# Patient Record
Sex: Female | Born: 1980
Health system: Southern US, Community
[De-identification: ages and names within clinical notes are randomized; demographics above are authoritative.]

## PROBLEM LIST (undated history)

## (undated) DIAGNOSIS — K219 Gastro-esophageal reflux disease without esophagitis: Secondary | ICD-10-CM

## (undated) DIAGNOSIS — K589 Irritable bowel syndrome without diarrhea: Secondary | ICD-10-CM

## (undated) HISTORY — PX: OTHER SURGICAL HISTORY: SHX169

## (undated) HISTORY — DX: Irritable bowel syndrome, unspecified: K58.9

## (undated) HISTORY — DX: Gastro-esophageal reflux disease without esophagitis: K21.9

## (undated) HISTORY — PX: WISDOM TOOTH EXTRACTION: SHX21

---

## 2003-10-30 ENCOUNTER — Other Ambulatory Visit: Admission: RE | Admit: 2003-10-30 | Discharge: 2003-10-30 | Payer: Self-pay

## 2004-06-24 ENCOUNTER — Other Ambulatory Visit: Admission: RE | Admit: 2004-06-24 | Discharge: 2004-06-24 | Payer: Self-pay

## 2007-09-26 ENCOUNTER — Inpatient Hospital Stay (HOSPITAL_COMMUNITY): Admission: AD | Admit: 2007-09-26 | Discharge: 2007-09-26 | Payer: Self-pay | Admitting: Obstetrics & Gynecology

## 2007-09-27 ENCOUNTER — Inpatient Hospital Stay (HOSPITAL_COMMUNITY): Admission: AD | Admit: 2007-09-27 | Discharge: 2007-09-29 | Payer: Self-pay | Admitting: Obstetrics and Gynecology

## 2009-01-03 ENCOUNTER — Inpatient Hospital Stay (HOSPITAL_COMMUNITY): Admission: RE | Admit: 2009-01-03 | Discharge: 2009-01-05 | Payer: Self-pay | Admitting: Obstetrics and Gynecology

## 2010-05-06 ENCOUNTER — Emergency Department (HOSPITAL_COMMUNITY): Admission: EM | Admit: 2010-05-06 | Discharge: 2010-05-06 | Payer: Self-pay | Admitting: Emergency Medicine

## 2011-01-11 LAB — BASIC METABOLIC PANEL
BUN: 18 mg/dL (ref 6–23)
Chloride: 105 mEq/L (ref 96–112)
Glucose, Bld: 96 mg/dL (ref 70–99)
Potassium: 3.4 mEq/L — ABNORMAL LOW (ref 3.5–5.1)

## 2011-01-11 LAB — URINALYSIS, ROUTINE W REFLEX MICROSCOPIC
Glucose, UA: NEGATIVE mg/dL
Specific Gravity, Urine: 1.02 (ref 1.005–1.030)
pH: 7.5 (ref 5.0–8.0)

## 2011-02-05 LAB — CBC
HCT: 25.6 % — ABNORMAL LOW (ref 36.0–46.0)
Hemoglobin: 8.4 g/dL — ABNORMAL LOW (ref 12.0–15.0)
Hemoglobin: 9.4 g/dL — ABNORMAL LOW (ref 12.0–15.0)
MCHC: 33 g/dL (ref 30.0–36.0)
MCV: 75.9 fL — ABNORMAL LOW (ref 78.0–100.0)
Platelets: 183 10*3/uL (ref 150–400)
RBC: 3.37 MIL/uL — ABNORMAL LOW (ref 3.87–5.11)
RBC: 3.75 MIL/uL — ABNORMAL LOW (ref 3.87–5.11)
WBC: 10 10*3/uL (ref 4.0–10.5)
WBC: 6.1 10*3/uL (ref 4.0–10.5)

## 2011-03-10 NOTE — Discharge Summary (Signed)
[name], Monica Young             ACCOUNT NO.:  [id number]   MEDICAL RECORD NO.:  [id number]          PATIENT TYPE:  INP   LOCATION:  9145                          FACILITY:  [facility]   PHYSICIAN:  Gerrit Friends. Aldona Bar, M.D.   DATE OF BIRTH:  Feb 12, 1981   DATE OF ADMISSION:  09/27/2007  DATE OF DISCHARGE:  09/29/2007                               DISCHARGE SUMMARY   DISCHARGE DIAGNOSES:  1. Term pregnancy, delivered 7-pound 15-ounce female infant, Apgars 7      and __________ .  2. Blood type O positive.  3. Postpartum anemia.   PROCEDURES:  1. Low forceps delivery.  2. Manual exploration of uterus with manual removal of placenta.  3. Partial third-degree tear with repair.   SUMMARY:  This 30 year old primigravida was admitted at term in labor  after a relatively uncomplicated pregnancy.  She progressed and was at  +3 station after approximately 2 hours of pushing, and her delivery was  aided by the use of forceps applied by Dr. Henderson Cloud.  She delivered a 7-  pound 15-ounce female infant with Apgars of 7 and 9 over a partial third-  degree tear which was also ultimately repaired without difficulty.  The  patient did have some difficulty with postpartum bleeding secondary to  the placenta having to be removed manually.  There was an estimated  blood loss of approximately 750 mL.  The patient did undergo manual  exploration of the uterus which was negative after placental delivery.  Her postpartum atony/bleeding was treated with Methergine and ultimately  Hemabate with good results.  Her postpartum hemoglobin was 6.1 on 12/03  and 5.6 on 12/04; it was 11.3 prior to delivery shortly after admission.  Her white count on the morning of 12/04 was 9800 and her platelet count  was 196,000.   In spite of the patient's anemia, she was able to ambulate pretty well  without difficulty and breastfeed without difficulty.  She was given  very specific appropriate instructions as to how to deal with the  fact  that her blood count was down in terms of activity, etc.  Blood  transfusion was discussed with the patient, but it was decided after  observing the patient and talking to the patient that it was not  medically necessary at this time.   The patient was given all appropriate instructions at the time of  discharge per discharge brochure and understood all instructions well.   DISCHARGE MEDICATIONS:  Include:  1. Vitamins 1 daily.  2. Feosol capsules 1 daily.  3. She will use Motrin 600 mg every 6 hours as needed for cramping or      pain and Tylox 1 every 4 to 6 hours as needed for severe cramping      and severe pain.   She was asked to return to the office in approximately 1 weeks' time for  followup and again in 4 weeks for her regular postpartum visit.  She was  given specific criteria to consider and was told that if she should  start feeling dizzy, very lightheaded, or was experiencing increased  bleeding  that she probably should return to [facility] for  consideration of a transfusion after evaluation and checking her blood  count.   At this point, the patient appears very stable for discharge and was  discharged to home with appropriate instructions.   CONDITION ON DISCHARGE:  Stable/improved.      Gerrit Friends. Aldona Bar, M.D.  Electronically Signed     RMW/MEDQ  D:  09/29/2007  T:  09/29/2007  Job:  [id number]

## 2011-03-10 NOTE — Discharge Summary (Signed)
[name], Monica Young NO.:  [id number]   MEDICAL RECORD NO.:  [id number]          PATIENT TYPE:  INP   LOCATION:  9127                          FACILITY:  [facility]   PHYSICIAN:  Gerrit Friends. Aldona Bar, M.D.   DATE OF BIRTH:  01-Sep-1981   DATE OF ADMISSION:  01/03/2009  DATE OF DISCHARGE:  01/05/2009                               DISCHARGE SUMMARY   DISCHARGE DIAGNOSES:  1. Term pregnancy delivered 7 pounds 11 ounces, female infant, Apgars 9      and 9.  2. Blood type O positive.  3. Induction of labor.   PROCEDURES:  1. Normal spontaneous delivery.  2. Second-degree midline episiotomy and repair.   SUMMARY:  This gravida 2, para 93, 30 year old was admitted at term for  elective induction by Dr. Henderson Cloud.  She progressed and subsequently had  a normal spontaneous delivery of a viable female infant weighing 7 pounds  11 ounces.  She was delivered over a second-degree midline episiotomy,  which was repaired without difficulty.  She had a normal postpartum  course.  Discharge hemoglobin was 8.4 with a white count of 10,000, and  platelet count of 183,000.  Baby was circumcised on the first postpartum  day.  The morning of January 05, 2009, mother was ambulating well,  tolerating a regular diet well, breast-feeding without difficulty.  Vital signs were stable.  She was comfortable.  She was given a  discharge brochure and understood all instructions well.   DISCHARGE MEDICATIONS:  Vitamins - 1 a day, Feosol capsules - 1 daily,  Motrin 600 mg every 6 hours as needed for mild cramping and pain, and  Tylox 1 or 2 every 4-6 hours as needed for more severe pain.   She will return the office for followup in approximately 4 weeks' time  or as needed.   CONDITION ON DISCHARGE:  Improved.      Gerrit Friends. Aldona Bar, M.D.  Electronically Signed     RMW/MEDQ  D:  01/05/2009  T:  01/05/2009  Job:  [id number]

## 2011-08-03 LAB — CBC
HCT: 16 — ABNORMAL LOW
HCT: 17.8 — ABNORMAL LOW
HCT: 31.5 — ABNORMAL LOW
Hemoglobin: 10.9 — ABNORMAL LOW
Hemoglobin: 11.3 — ABNORMAL LOW
Hemoglobin: 5.6 — CL
MCHC: 34.5
MCHC: 34.7
MCV: 84.9
MCV: 85.4
MCV: 85.5
Platelets: 180
Platelets: 196
RBC: 2.08 — ABNORMAL LOW
RBC: 3.71 — ABNORMAL LOW
RBC: 3.86 — ABNORMAL LOW
RDW: 13.2
WBC: 12.6 — ABNORMAL HIGH
WBC: 9.8

## 2011-08-03 LAB — COMPREHENSIVE METABOLIC PANEL
BUN: 3 — ABNORMAL LOW
CO2: 19
Calcium: 8.9
Creatinine, Ser: 0.51
GFR calc non Af Amer: >60
Glucose, Bld: 91
Total Protein: 6.2

## 2011-08-03 LAB — LACTATE DEHYDROGENASE: LDH: 171

## 2011-08-03 LAB — URIC ACID: Uric Acid, Serum: 5

## 2011-08-03 LAB — RPR: RPR Ser Ql: NONREACTIVE

## 2012-05-20 ENCOUNTER — Other Ambulatory Visit: Payer: Self-pay | Admitting: Family Medicine

## 2012-05-20 DIAGNOSIS — R109 Unspecified abdominal pain: Secondary | ICD-10-CM

## 2012-05-26 ENCOUNTER — Ambulatory Visit (HOSPITAL_COMMUNITY)
Admission: RE | Admit: 2012-05-26 | Discharge: 2012-05-26 | Disposition: A | Discharge status: Home / Self Care | Payer: No Typology Code available for payment source | Source: Ambulatory Visit | Attending: Family Medicine | Admitting: Family Medicine

## 2012-05-26 DIAGNOSIS — R109 Unspecified abdominal pain: Secondary | ICD-10-CM

## 2013-03-07 ENCOUNTER — Telehealth: Payer: Self-pay | Admitting: Family Medicine

## 2013-03-07 MED ORDER — FLUCONAZOLE 150 MG PO TABS: ORAL_TABLET | ORAL | Status: DC

## 2013-03-07 NOTE — Telephone Encounter (Signed)
Sent RX to [facility]. Patient was notified.

## 2013-03-07 NOTE — Telephone Encounter (Signed)
Diflucan 150 mg. #2. One every week. One refill.

## 2013-03-07 NOTE — Telephone Encounter (Signed)
Pt would like diflucan x2 for yeast infection, has already tried OTC and not working. [facility]

## 2013-03-08 ENCOUNTER — Encounter: Payer: Self-pay | Admitting: *Deleted

## 2013-06-27 ENCOUNTER — Telehealth: Payer: Self-pay | Admitting: Family Medicine

## 2013-06-27 NOTE — Telephone Encounter (Signed)
Patient needs refill on diflucan for yeast infection called into [facility].

## 2013-06-30 ENCOUNTER — Other Ambulatory Visit: Payer: Self-pay | Admitting: Nurse Practitioner

## 2013-06-30 ENCOUNTER — Telehealth: Payer: Self-pay | Admitting: Family Medicine

## 2013-06-30 MED ORDER — FLUCONAZOLE 150 MG PO TABS: ORAL_TABLET | ORAL | Status: DC

## 2013-06-30 NOTE — Telephone Encounter (Signed)
Patient phoned our office on 06/27/2013 regarding a refill on fluconazole (DIFLUCAN) 150 MG tablet.  States her symptoms are worse today as she was at the pool all day on Labor Day which patient states makes these symptoms appear.    Please phone fluconazole (DIFLUCAN) 150 MG tablet into [facility] in [place], [place]  Please call Patient. Thanks

## 2013-08-23 ENCOUNTER — Encounter: Payer: Self-pay | Admitting: Nurse Practitioner

## 2013-08-23 ENCOUNTER — Ambulatory Visit (INDEPENDENT_AMBULATORY_CARE_PROVIDER_SITE_OTHER): Payer: 59 | Admitting: Nurse Practitioner

## 2013-08-23 VITALS — BP 128/80 | Ht 64.0 in | Wt 130.2 lb

## 2013-08-23 DIAGNOSIS — K589 Irritable bowel syndrome without diarrhea: Secondary | ICD-10-CM

## 2013-08-23 DIAGNOSIS — F341 Dysthymic disorder: Secondary | ICD-10-CM

## 2013-08-23 DIAGNOSIS — F329 Major depressive disorder, single episode, unspecified: Secondary | ICD-10-CM

## 2013-08-23 MED ORDER — HYOSCYAMINE SULFATE 0.125 MG SL SUBL: 0.1250 mg | SUBLINGUAL_TABLET | SUBLINGUAL | Status: DC | PRN

## 2013-08-23 MED ORDER — SERTRALINE HCL 50 MG PO TABS: 50.0000 mg | ORAL_TABLET | Freq: Every day | ORAL | Status: DC

## 2013-08-23 MED ORDER — DIPHENOXYLATE-ATROPINE 2.5-0.025 MG PO TABS: 1.0000 | ORAL_TABLET | Freq: Four times a day (QID) | ORAL | Status: DC | PRN

## 2013-08-23 NOTE — Patient Instructions (Signed)
Irritable Bowel Syndrome °Irritable Bowel Syndrome (IBS) is caused by a disturbance of normal bowel function. Other terms used are spastic colon, mucous colitis, and irritable colon. It does not require surgery, nor does it lead to cancer. There is no cure for IBS. But with proper diet, stress reduction, and medication, you will find that your problems (symptoms) will gradually disappear or improve. IBS is a common digestive disorder. It usually appears in late adolescence or early adulthood. Women develop it twice as often as men. °CAUSES  °After food has been digested and absorbed in the small intestine, waste material is moved into the colon (large intestine). In the colon, water and salts are absorbed from the undigested products coming from the small intestine. The remaining residue, or fecal material, is held for elimination. Under normal circumstances, gentle, rhythmic contractions on the bowel walls push the fecal material along the colon towards the rectum. In IBS, however, these contractions are irregular and poorly coordinated. The fecal material is either retained too long, resulting in constipation, or expelled too soon, producing diarrhea. °SYMPTOMS  °The most common symptom of IBS is pain. It is typically in the lower left side of the belly (abdomen). But it may occur anywhere in the abdomen. It can be felt as heartburn, backache, or even as a dull pain in the arms or shoulders. The pain comes from excessive bowel-muscle spasms and from the buildup of gas and fecal material in the colon. This pain: °· Can range from sharp belly (abdominal) cramps to a dull, continuous ache. °· Usually worsens soon after eating. °· Is typically relieved by having a bowel movement or passing gas. °Abdominal pain is usually accompanied by constipation. But it may also produce diarrhea. The diarrhea typically occurs right after a meal or upon arising in the morning. The stools are typically soft and watery. They are often  flecked with secretions (mucus). °Other symptoms of IBS include: °· Bloating. °· Loss of appetite. °· Heartburn. °· Feeling sick to your stomach (nausea). °· Belching °· Vomiting °· Gas. °IBS may also cause a number of symptoms that are unrelated to the digestive system: °· Fatigue. °· Headaches. °· Anxiety °· Shortness of breath °· Difficulty in concentrating. °· Dizziness. °These symptoms tend to come and go. °DIAGNOSIS  °The symptoms of IBS closely mimic the symptoms of other, more serious digestive disorders. So your caregiver may wish to perform a variety of additional tests to exclude these disorders. He/she wants to be certain of learning what is wrong (diagnosis). The nature and purpose of each test will be explained to you. °TREATMENT °A number of medications are available to help correct bowel function and/or relieve bowel spasms and abdominal pain. Among the drugs available are: °· Mild, non-irritating laxatives for severe constipation and to help restore normal bowel habits. °· Specific anti-diarrheal medications to treat severe or prolonged diarrhea. °· Anti-spasmodic agents to relieve intestinal cramps. °· Your caregiver may also decide to treat you with a mild tranquilizer or sedative during unusually stressful periods in your life. °The important thing to remember is that if any drug is prescribed for you, make sure that you take it exactly as directed. Make sure that your caregiver knows how well it worked for you. °HOME CARE INSTRUCTIONS  °· Avoid foods that are high in fat or oils. Some examples are:heavy cream, butter, frankfurters, sausage, and other fatty meats. °· Avoid foods that have a laxative effect, such as fruit, fruit juice, and dairy products. °· Cut out   carbonated drinks, chewing gum, and "gassy" foods, such as beans and cabbage. This may help relieve bloating and belching. °· Bran taken with plenty of liquids may help relieve constipation. °· Keep track of what foods seem to trigger  your symptoms. °· Avoid emotionally charged situations or circumstances that produce anxiety. °· Start or continue exercising. °· Get plenty of rest and sleep. °MAKE SURE YOU:  °· Understand these instructions. °· Will watch your condition. °· Will get help right away if you are not doing well or get worse. °Document Released: 10/12/2005 Document Revised: 01/04/2012 Document Reviewed: 06/01/2008 °ExitCare® Patient Information ©2014 [employer]. ° °

## 2013-08-30 ENCOUNTER — Encounter: Payer: Self-pay | Admitting: Nurse Practitioner

## 2013-08-30 DIAGNOSIS — F329 Major depressive disorder, single episode, unspecified: Secondary | ICD-10-CM | POA: Insufficient documentation

## 2013-08-30 DIAGNOSIS — K589 Irritable bowel syndrome without diarrhea: Secondary | ICD-10-CM | POA: Insufficient documentation

## 2013-08-30 NOTE — Assessment & Plan Note (Signed)
Start Zoloft as directed. Recheck in one month.

## 2013-08-30 NOTE — Progress Notes (Signed)
Subjective:    Patient ID: Monica Young, female    DOB: May 25, 1981, 32 y.o.   MRN: [id number]  HPI presents for wellness checkup. Has Mirena for birth control. No bleeding. Same sexual partner. Rare acid reflux. Diarrhea, gas and bloating. No fever. No blood in stool. No change in color of stools. Under more stress; nervous and anxious at times. Symptoms worse with greasy foods.  Fatigue and hypersomnia for 5-6 months. No early morning awakenings. Denies suicidal thoughts or ideations.  History of IBS according to paper chart. Regular physicals with GYN.    Review of Systems  Constitutional: Negative for activity change, appetite change and fatigue.  HENT: Negative for dental problem, ear pain, hearing loss, rhinorrhea and sore throat.   Eyes: Negative for visual disturbance.  Respiratory: Negative for cough, chest tightness, shortness of breath and wheezing.   Cardiovascular: Negative for chest pain.  Gastrointestinal: Positive for nausea, diarrhea and abdominal distention. Negative for vomiting, abdominal pain, constipation and blood in stool.  Genitourinary: Negative for dysuria, urgency, frequency, vaginal bleeding, vaginal discharge, difficulty urinating, menstrual problem and pelvic pain.  Psychiatric/Behavioral: Positive for dysphoric mood. Negative for suicidal ideas. The patient is nervous/anxious.        Objective:   Physical Exam  Vitals reviewed. Constitutional: She is oriented to person, place, and time. She appears well-developed. No distress.  HENT:  Right Ear: External ear normal.  Left Ear: External ear normal.  Mouth/Throat: Oropharynx is clear and moist.  Neck: Normal range of motion. Neck supple. No tracheal deviation present. No thyromegaly present.  Cardiovascular: Normal rate, regular rhythm and normal heart sounds.  Exam reveals no gallop.   No murmur heard. Pulmonary/Chest: Effort normal and breath sounds normal.  Abdominal: Soft. Bowel sounds are normal.  She exhibits no distension and no mass. There is no tenderness. There is no rebound and no guarding.  Genitourinary: Vagina normal and uterus normal. No vaginal discharge found.  Musculoskeletal: She exhibits no edema.  Lymphadenopathy:    She has no cervical adenopathy.  Neurological: She is alert and oriented to person, place, and time.  Skin: Skin is warm and dry. No rash noted.  Psychiatric: She has a normal mood and affect. Her behavior is normal.  Abdominal US July 2013 was normal. Correction to above: No GU exam performed today.        Assessment & Plan:  Irritable bowel syndrome  Anxiety and depression    Meds ordered this encounter  Medications  . sertraline (ZOLOFT) 50 MG tablet    Sig: Take 1 tablet (50 mg total) by mouth daily.    Dispense:  30 tablet    Refill:  2    Order Specific Question:  Supervising Provider    Answer:  Merlyn Albert [[id number]]  . diphenoxylate-atropine (LOMOTIL) 2.5-0.025 MG per tablet    Sig: Take 1 tablet by mouth 4 (four) times daily as needed for diarrhea or loose stools.    Dispense:  30 tablet    Refill:  0    Order Specific Question:  Supervising Provider    Answer:  Merlyn Albert [[id number]]  . hyoscyamine (LEVSIN/SL) 0.125 MG SL tablet    Sig: Place 1 tablet (0.125 mg total) under the tongue every 4 (four) hours as needed for cramping.    Dispense:  30 tablet    Refill:  0    Order Specific Question:  Supervising Provider    Answer:  Merlyn Albert [[id number]]   Follow up  in one month. Call sooner if any problems.

## 2013-08-30 NOTE — Assessment & Plan Note (Signed)
Start Levsin and Lomotil as directed. Given information on IBS.

## 2013-08-31 ENCOUNTER — Other Ambulatory Visit: Payer: Self-pay

## 2013-09-20 ENCOUNTER — Encounter: Payer: Self-pay | Admitting: Family Medicine

## 2013-09-20 ENCOUNTER — Ambulatory Visit (INDEPENDENT_AMBULATORY_CARE_PROVIDER_SITE_OTHER): Payer: 59 | Admitting: Family Medicine

## 2013-09-20 VITALS — BP 112/68 | Ht 64.0 in | Wt 130.2 lb

## 2013-09-20 DIAGNOSIS — R5381 Other malaise: Secondary | ICD-10-CM

## 2013-09-20 DIAGNOSIS — D649 Anemia, unspecified: Secondary | ICD-10-CM

## 2013-09-20 DIAGNOSIS — R197 Diarrhea, unspecified: Secondary | ICD-10-CM

## 2013-09-20 LAB — CBC WITH DIFFERENTIAL/PLATELET
Basophils Absolute: 0 10*3/uL (ref 0.0–0.1)
Basophils Relative: 0 % (ref 0–1)
Eosinophils Absolute: 0.1 10*3/uL (ref 0.0–0.7)
Eosinophils Relative: 1 % (ref 0–5)
HCT: 36.3 % (ref 36.0–46.0)
Hemoglobin: 12.4 g/dL (ref 12.0–15.0)
MCH: 28.6 pg (ref 26.0–34.0)
MCHC: 34.2 g/dL (ref 30.0–36.0)
Monocytes Absolute: 0.3 10*3/uL (ref 0.1–1.0)
Monocytes Relative: 6 % (ref 3–12)
Neutro Abs: 3.6 10*3/uL (ref 1.7–7.7)
RDW: 13.5 % (ref 11.5–15.5)

## 2013-09-20 MED ORDER — SERTRALINE HCL 100 MG PO TABS: 100.0000 mg | ORAL_TABLET | Freq: Every day | ORAL | Status: DC

## 2013-09-20 NOTE — Progress Notes (Signed)
   Subjective:    Patient ID: Monica Young, female    DOB: 06-02-1981, 32 y.o.   MRN: [id number]  HPI Patient is here today for a follow up visit on anxiety ad depression. Currently taking zoloft 50 mg daily. Patient states that her symptoms are about the same and medication doesn't seem to help much.  Patient has been having melancholy. She denies being super sad denies crying spells she has lack of interest in doing things. She relates she has a lot of abdominal cramps loose stools in addition to that she finds her energy level subpar. She relates frustrations in dealing with some of the stresses of life not suicidal or homicidal. PMH IBS Review of Systems See above    Objective:   Physical Exam Lungs are clear hearts regular pulse normal weight good blood pressure good patient not tearful. Able to smile with interaction.       Assessment & Plan:  #1 depression/dysthymia -- increase Zoloft to 100 mg daily patient will send me a message in a few weeks time to let you know how she is doing if she should become suicidal or severely depressed to followup immediately or go to the ER  #2 loose stools intermittent abdominal discomforts will do extensive lab testing including looking for celiac disease  #3 fatigue check thyroid function has history of anemia check CBC

## 2013-09-21 LAB — BASIC METABOLIC PANEL
BUN: 12 mg/dL (ref 6–23)
CO2: 27 mEq/L (ref 19–32)
Chloride: 105 mEq/L (ref 96–112)
Glucose, Bld: 78 mg/dL (ref 70–99)
Potassium: 3.8 mEq/L (ref 3.5–5.3)
Sodium: 140 mEq/L (ref 135–145)

## 2013-09-25 LAB — TISSUE TRANSGLUTAMINASE, IGA: Tissue Transglutaminase Ab, IgA: 2.6 U/mL (ref <20)

## 2014-01-31 ENCOUNTER — Telehealth: Payer: Self-pay | Admitting: Family Medicine

## 2014-01-31 NOTE — Telephone Encounter (Signed)
Patient is going on vacation and she has to ride in an airplane. She is wanting to know if she can possibly get 4 pills or something for her nerves so that she can be calm on the plane. [facility]

## 2014-02-01 MED ORDER — ALPRAZOLAM 0.5 MG PO TABS: ORAL_TABLET | ORAL | Status: DC

## 2014-02-01 NOTE — Telephone Encounter (Signed)
Xanax .5 mg numb four one one hr before flight

## 2014-02-01 NOTE — Telephone Encounter (Signed)
Patient notified script will be faxed to pharmacy once doctor signs it. Patient verbalized understanding.

## 2014-02-14 ENCOUNTER — Other Ambulatory Visit: Payer: Self-pay | Admitting: Nurse Practitioner

## 2014-02-14 ENCOUNTER — Other Ambulatory Visit: Payer: Self-pay | Admitting: *Deleted

## 2014-02-14 ENCOUNTER — Telehealth: Payer: Self-pay | Admitting: Family Medicine

## 2014-02-14 MED ORDER — FLUCONAZOLE 150 MG PO TABS: ORAL_TABLET | ORAL | Status: DC

## 2014-02-14 NOTE — Telephone Encounter (Signed)
Pt needs diflucan x2 two please   Monica Young [facility]   [place] going to [place] 16[place]  Wants to know if she can get  Some diflucan x2 for that trip as well Or does she need to call back for that?

## 2014-02-15 NOTE — Telephone Encounter (Signed)
Was this pt going to be able to get the  x2 diflucan to have on hand for her trip to [place]?

## 2014-03-01 ENCOUNTER — Telehealth: Payer: Self-pay | Admitting: Family Medicine

## 2014-03-01 MED ORDER — ALPRAZOLAM 0.5 MG PO TABS: ORAL_TABLET | ORAL | Status: DC

## 2014-03-01 NOTE — Telephone Encounter (Signed)
[facility]

## 2014-03-01 NOTE — Telephone Encounter (Signed)
Notified patient script will be faxed to pharmacy. FYI- Patient states she never picked up the xanax rx in April from pharmacy. Pharmacy told her they never received anything from us.

## 2014-03-01 NOTE — Telephone Encounter (Signed)
Last seen 09/2013

## 2014-03-01 NOTE — Telephone Encounter (Signed)
1 refill, will need OV before further

## 2014-03-21 ENCOUNTER — Encounter: Payer: 59 | Admitting: Adult Health

## 2014-03-28 ENCOUNTER — Encounter: Payer: 59 | Admitting: Adult Health

## 2014-05-09 ENCOUNTER — Encounter: Payer: Self-pay | Admitting: Women's Health

## 2014-05-09 ENCOUNTER — Other Ambulatory Visit (HOSPITAL_COMMUNITY)
Admission: RE | Admit: 2014-05-09 | Discharge: 2014-05-09 | Disposition: A | Discharge status: Home / Self Care | Payer: BC Managed Care – PPO | Source: Ambulatory Visit | Attending: Obstetrics & Gynecology | Admitting: Obstetrics & Gynecology

## 2014-05-09 ENCOUNTER — Ambulatory Visit (INDEPENDENT_AMBULATORY_CARE_PROVIDER_SITE_OTHER): Payer: BC Managed Care – PPO | Admitting: Women's Health

## 2014-05-09 VITALS — BP 98/58 | Ht 64.0 in | Wt 133.0 lb

## 2014-05-09 DIAGNOSIS — Z01419 Encounter for gynecological examination (general) (routine) without abnormal findings: Secondary | ICD-10-CM

## 2014-05-09 DIAGNOSIS — Z1151 Encounter for screening for human papillomavirus (HPV): Secondary | ICD-10-CM | POA: Insufficient documentation

## 2014-05-09 DIAGNOSIS — Z3009 Encounter for other general counseling and advice on contraception: Secondary | ICD-10-CM

## 2014-05-09 NOTE — Progress Notes (Signed)
Subjective:   Monica Young is a 33 y.o.  Caucasian female here for a routine well-woman exam.  No LMP recorded. Patient is not currently having periods (Reason: IUD).    Current complaints: Time for her Mirena to be removed, would like another. Has been having cramping and diarrhea for years, has been dx w/ IBS- not on any meds- was concerned it may be coming from mirena. Does not have periods on Mirena, I do not feel like her symptoms are r/t mirena.  States at her last exam, Mirena strings were not visible, had an u/s to confirm still there- and it was.  PCP: Dr. 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  Past Medical History Past Medical History  Diagnosis Date  . IBS (irritable bowel syndrome)     Past Surgical History History reviewed. No pertinent past surgical history.  Gynecologic History No obstetric history on file.  No LMP recorded. Patient is not currently having periods (Reason: IUD). Contraception: IUD Last Pap: unsure, >7785yr ago. Results were: normal Last mammogram: never. Results were: n/a Last TCS: never  Obstetric History OB History  No data available    Current Medications Current Outpatient Prescriptions on File Prior to Visit  Medication Sig Dispense Refill  . ALPRAZolam (XANAX) 0.5 MG tablet Take one tablet one hour before flight.  4 tablet  0  . diphenoxylate-atropine (LOMOTIL) 2.5-0.025 MG per tablet Take 1 tablet by mouth 4 (four) times daily as needed for diarrhea or loose stools.  30  tablet  0  . fluconazole (DIFLUCAN) 150 MG tablet One po qd prn yeast infection; may repeat in 3-4 days if needed  2 tablet  0  . hyoscyamine (LEVSIN/SL) 0.125 MG SL tablet Place 1 tablet (0.125 mg total) under the tongue every 4 (four) hours as needed for cramping.  30 tablet  0  . sertraline (ZOLOFT) 100 MG tablet Take 1 tablet (100 mg total) by mouth daily.  30 tablet  5   No current facility-administered medications on file prior to visit.    Review of Systems Patient denies any headaches, blurred vision, shortness of breath, chest pain, abdominal pain, problems with bowel movements, urination, or intercourse.  Objective:  BP 98/58  Ht 5\' 4"  (1.626 m)  Wt 133 lb (60.328 kg)  BMI 22.82 kg/m2 Physical Exam  General:  Well developed, well nourished, no acute distress. She is alert and oriented x3. Skin:  Warm and dry Neck:  Midline trachea, no thyromegaly or nodules Cardiovascular: Regular rate and rhythm, no murmur heard Lungs:  Effort normal, all lung fields clear to auscultation bilaterally Breasts:  No dominant palpable mass, retraction, or nipple discharge Abdomen:  Soft, non tender, no hepatosplenomegaly or masses Pelvic:  External genitalia is normal in appearance.  The vagina is normal in appearance. The cervix is bulbous, no CMT. Mirena strings NOT VISIBLE.  Thin prep pap is done w/  HR HPV cotesting. Uterus is felt to be normal size, shape, and contour.  No adnexal masses or tenderness noted. Extremities:  No swelling or varicosities noted Psych:  She has a normal mood and affect  Assessment:   Healthy well-woman exam Contraception counseling  Plan:  F/U tomorrow for Mirena removal and reinsertion- strings not visible- premedicate w/ ibuprofen Immodium prn diarrhea, can discuss further w/ PCP if needed Mammogram @33yo  or sooner if problems Colonoscopy @33yo  or sooner if problems  Marge Duncans CNM, [facility] 05/09/2014 9:11 AM

## 2014-05-09 NOTE — Patient Instructions (Signed)
Ibuprofen 400mg  2 hours before coming  Levonorgestrel intrauterine device (IUD)-Mirena What is this medicine? LEVONORGESTREL IUD (LEE voe nor jes trel) is a contraceptive (birth control) device. The device is placed inside the uterus by a healthcare professional. It is used to prevent pregnancy and can also be used to treat heavy bleeding that occurs during your period. Depending on the device, it can be used for 3 to 5 years. This medicine may be used for other purposes; ask your health care provider or pharmacist if you have questions. COMMON BRAND NAME(S): Gretta CoolMirena, Skyla What should I tell my health care provider before I take this medicine? They need to know if you have any of these conditions: -abnormal Pap smear -cancer of the breast, uterus, or cervix -diabetes -endometritis -genital or pelvic infection now or in the past -have more than one sexual partner or your partner has more than one partner -heart disease -history of an ectopic or tubal pregnancy -immune system problems -IUD in place -liver disease or tumor -problems with blood clots or take blood-thinners -use intravenous drugs -uterus of unusual shape -vaginal bleeding that has not been explained -an unusual or allergic reaction to levonorgestrel, other hormones, silicone, or polyethylene, medicines, foods, dyes, or preservatives -pregnant or trying to get pregnant -breast-feeding How should I use this medicine? This device is placed inside the uterus by a health care professional. Talk to your pediatrician regarding the use of this medicine in children. Special care may be needed. Overdosage: If you think you have taken too much of this medicine contact a poison control center or emergency room at once. NOTE: This medicine is only for you. Do not share this medicine with others. What if I miss a dose? This does not apply. What may interact with this medicine? Do not take this medicine with any of the following  medications: -amprenavir -bosentan -fosamprenavir This medicine may also interact with the following medications: -aprepitant -barbiturate medicines for inducing sleep or treating seizures -bexarotene -griseofulvin -medicines to treat seizures like carbamazepine, ethotoin, felbamate, oxcarbazepine, phenytoin, topiramate -modafinil -pioglitazone -rifabutin -rifampin -rifapentine -some medicines to treat HIV infection like atazanavir, indinavir, lopinavir, nelfinavir, tipranavir, ritonavir -St. John's wort -warfarin This list may not describe all possible interactions. Give your health care provider a list of all the medicines, herbs, non-prescription drugs, or dietary supplements you use. Also tell them if you smoke, drink alcohol, or use illegal drugs. Some items may interact with your medicine. What should I watch for while using this medicine? Visit your doctor or health care professional for regular check ups. See your doctor if you or your partner has sexual contact with others, becomes HIV positive, or gets a sexual transmitted disease. This product does not protect you against HIV infection (AIDS) or other sexually transmitted diseases. You can check the placement of the IUD yourself by reaching up to the top of your vagina with clean fingers to feel the threads. Do not pull on the threads. It is a good habit to check placement after each menstrual period. Call your doctor right away if you feel more of the IUD than just the threads or if you cannot feel the threads at all. The IUD may come out by itself. You may become pregnant if the device comes out. If you notice that the IUD has come out use a backup birth control method like condoms and call your health care provider. Using tampons will not change the position of the IUD and are okay to use during  your period. What side effects may I notice from receiving this medicine? Side effects that you should report to your doctor or  health care professional as soon as possible: -allergic reactions like skin rash, itching or hives, swelling of the face, lips, or tongue -fever, flu-like symptoms -genital sores -high blood pressure -no menstrual period for 6 weeks during use -pain, swelling, warmth in the leg -pelvic pain or tenderness -severe or sudden headache -signs of pregnancy -stomach cramping -sudden shortness of breath -trouble with balance, talking, or walking -unusual vaginal bleeding, discharge -yellowing of the eyes or skin Side effects that usually do not require medical attention (report to your doctor or health care professional if they continue or are bothersome): -acne -breast pain -change in sex drive or performance -changes in weight -cramping, dizziness, or faintness while the device is being inserted -headache -irregular menstrual bleeding within first 3 to 6 months of use -nausea This list may not describe all possible side effects. Call your doctor for medical advice about side effects. You may report side effects to FDA at 1-800-FDA-1088. Where should I keep my medicine? This does not apply. NOTE: This sheet is a summary. It may not cover all possible information. If you have questions about this medicine, talk to your doctor, pharmacist, or health care provider.  2015, Elsevier/Gold Standard. (2011-11-12 13:54:04)

## 2014-05-10 ENCOUNTER — Encounter: Payer: Self-pay | Admitting: Advanced Practice Midwife

## 2014-05-10 ENCOUNTER — Ambulatory Visit (INDEPENDENT_AMBULATORY_CARE_PROVIDER_SITE_OTHER): Payer: BC Managed Care – PPO | Admitting: Advanced Practice Midwife

## 2014-05-10 DIAGNOSIS — Z975 Presence of (intrauterine) contraceptive device: Principal | ICD-10-CM

## 2014-05-10 DIAGNOSIS — Z3202 Encounter for pregnancy test, result negative: Secondary | ICD-10-CM

## 2014-05-10 DIAGNOSIS — Z30432 Encounter for removal of intrauterine contraceptive device: Secondary | ICD-10-CM

## 2014-05-10 DIAGNOSIS — Z538 Procedure and treatment not carried out for other reasons: Secondary | ICD-10-CM

## 2014-05-10 LAB — CYTOLOGY - PAP

## 2014-05-10 LAB — POCT URINE PREGNANCY: Preg Test, Ur: NEGATIVE

## 2014-05-10 MED ORDER — MISOPROSTOL 200 MCG PO TABS: 200.0000 ug | ORAL_TABLET | Freq: Once | ORAL | Status: DC

## 2014-05-10 NOTE — Progress Notes (Signed)
Zenovia Jarredabitha D Hokenson is a 33 y.o. year old Caucasian female   who presents for removal and replacement of a Mirena IUD.  It has been 5 years since her previous IUD placement.  Past Medical History  Diagnosis Date  . IBS (irritable bowel syndrome)    History reviewed. No pertinent past surgical history.  Family History  Problem Relation Age of Onset  . Hypertension Father   . Heart disease Father   . Pancreatitis Brother   . Heart disease Maternal Grandmother   . Stroke Maternal Grandfather   . Cancer Paternal Grandmother     lung  . COPD Paternal Grandfather   . Heart disease Paternal Grandfather      The risks and benefits of the method and placement have been thouroughly reviewed with the patient and all questions were answered.  Specifically the patient is aware of failure rate of 10/998, expulsion of the IUD and of possible perforation.  The patient is aware of irregular bleeding due to the method and understands the incidence of irregular bleeding diminishes with time.  Time out was performed.  A Graves speculum was placed.  The cervix was prepped using Betadine. The strings were found to be not visible.  Placement confirmed by us at the very top of the fundus.  Despite multiple attempts with multiple instruments by myself and Dr. Emelda FearFerguson, removal was unsuccessful.  ASSESSMENT:  Unsuccessful removal of IUD  PLAN:  F/u at a later date;  Premedicate with PO cytotec and plan a paracervical block

## 2014-05-16 ENCOUNTER — Encounter: Payer: Self-pay | Admitting: Obstetrics and Gynecology

## 2014-05-16 ENCOUNTER — Ambulatory Visit (INDEPENDENT_AMBULATORY_CARE_PROVIDER_SITE_OTHER): Payer: BC Managed Care – PPO | Admitting: Obstetrics and Gynecology

## 2014-05-16 VITALS — BP 118/72 | Ht 64.0 in

## 2014-05-16 DIAGNOSIS — Z30432 Encounter for removal of intrauterine contraceptive device: Secondary | ICD-10-CM | POA: Insufficient documentation

## 2014-05-16 MED ORDER — NORGESTIMATE-ETH ESTRADIOL 0.25-35 MG-MCG PO TABS: 1.0000 | ORAL_TABLET | Freq: Every day | ORAL | Status: DC

## 2014-05-16 MED ORDER — DOXYCYCLINE HYCLATE 100 MG PO CAPS: 100.0000 mg | ORAL_CAPSULE | Freq: Two times a day (BID) | ORAL | Status: DC

## 2014-05-16 NOTE — Progress Notes (Signed)
This chart was scribed by Leone PayorSonum Patel, Medical Scribe, for Dr. Christin BachJohn Cyenna Rebello on 05/16/14 at 3:34 PM. This chart was reviewed by Dr. Christin BachJohn Jamear Carbonneau for accuracy.    Monica Young is a 33 y.o. female who presents today for an IUD removal and possible reinsertion. She states [facility] her last baby.  IUD Removal  Patient was in the dorsal lithotomy position, normal external genitalia was noted.  A speculum was placed in the patient's vagina, normal discharge was noted, no lesions. The multiparous cervix was visualized, no lesions, no abnormal discharge.  The strings of the IUD were grasped and pulled using ring forceps. The IUD was removed in its entirety. Ultrasound used and after multiple attempts, uterine packing forcep were able to remove the iud. (The strings of the IUD were not visualized, so Kelly forceps were introduced into the endometrial cavity and the IUD was grasped and removed in its entirety).  Patient tolerated the procedure well.    Patient will use OCP for contraception. Routine preventative health maintenance measures emphasized.

## 2014-05-17 ENCOUNTER — Ambulatory Visit: Payer: BC Managed Care – PPO | Admitting: Obstetrics & Gynecology

## 2014-05-28 ENCOUNTER — Telehealth: Payer: Self-pay | Admitting: Adult Health

## 2014-05-28 MED ORDER — FLUCONAZOLE 150 MG PO TABS: ORAL_TABLET | ORAL | Status: DC

## 2014-05-28 NOTE — Telephone Encounter (Signed)
rx diflucan with 1 refill

## 2014-05-28 NOTE — Telephone Encounter (Signed)
Pt states Dr. Emelda FearFerguson doxycycline a few days ago now having vaginal itching, thinks she has a yeast infection. Monistat not helping.   Requesting med for yeast.

## 2014-08-27 ENCOUNTER — Encounter: Payer: Self-pay | Admitting: Obstetrics and Gynecology

## 2015-01-01 ENCOUNTER — Ambulatory Visit (INDEPENDENT_AMBULATORY_CARE_PROVIDER_SITE_OTHER): Payer: BLUE CROSS/BLUE SHIELD | Admitting: Family Medicine

## 2015-01-01 ENCOUNTER — Encounter: Payer: Self-pay | Admitting: Family Medicine

## 2015-01-01 VITALS — BP 112/74 | Temp 98.9°F | Ht 64.0 in | Wt 130.0 lb

## 2015-01-01 DIAGNOSIS — J329 Chronic sinusitis, unspecified: Secondary | ICD-10-CM | POA: Diagnosis not present

## 2015-01-01 MED ORDER — CEFDINIR 300 MG PO CAPS: 300.0000 mg | ORAL_CAPSULE | Freq: Two times a day (BID) | ORAL | Status: DC

## 2015-01-01 MED ORDER — MUPIROCIN 2 % EX OINT: TOPICAL_OINTMENT | CUTANEOUS | Status: AC

## 2015-01-01 MED ORDER — ONDANSETRON 4 MG PO TBDP: 4.0000 mg | ORAL_TABLET | Freq: Three times a day (TID) | ORAL | Status: DC | PRN

## 2015-01-01 NOTE — Progress Notes (Signed)
   Subjective:    Patient ID: Zenovia Jarredabitha D Rabalais, female    DOB: 05/19/1981, 34 y.o.   MRN: [id number]  Cough This is a new problem. Episode onset: off and on for 3 weeks. Associated symptoms include a fever, headaches and a sore throat. Associated symptoms comments: Vomiting, runny nose,. Treatments tried: tylenol cold and flu, tylenol.  tyl cold and cong  soe throat new as of yesterday  Green disch and body aches  Joint aching  Thought she had the flu  Vomited several times  No smoking   Review of Systems  Constitutional: Positive for fever.  HENT: Positive for sore throat.   Respiratory: Positive for cough.   Neurological: Positive for headaches.       Objective:   Physical Exam   alert moderate malaise. Vitals stable. HEENT moderate nasal congestion duration of neck supple. Lungs clear no wheezes no crackles heart regular in rhythm      Assessment & Plan:   impression post viral rhinosinusitis/bronchitis plan Omnicef twice a day 10 days. Symptom care discussed. Warning signs discussed. WSL

## 2015-01-03 ENCOUNTER — Telehealth: Payer: Self-pay | Admitting: Family Medicine

## 2015-01-03 MED ORDER — FLUCONAZOLE 150 MG PO TABS: 150.0000 mg | ORAL_TABLET | Freq: Once | ORAL | Status: DC

## 2015-01-03 NOTE — Telephone Encounter (Signed)
Med sent per protocol. Pt notified.

## 2015-01-03 NOTE — Telephone Encounter (Signed)
Patient was put on an antibiotic on 01/01/15 and now has a yeast infection.  Can we call in diflucan?   [facility]

## 2015-02-18 ENCOUNTER — Encounter: Payer: Self-pay | Admitting: Family Medicine

## 2015-02-18 ENCOUNTER — Ambulatory Visit (INDEPENDENT_AMBULATORY_CARE_PROVIDER_SITE_OTHER): Payer: BLUE CROSS/BLUE SHIELD | Admitting: Family Medicine

## 2015-02-18 VITALS — BP 106/70 | Ht 64.0 in | Wt 131.2 lb

## 2015-02-18 DIAGNOSIS — E785 Hyperlipidemia, unspecified: Secondary | ICD-10-CM | POA: Diagnosis not present

## 2015-02-18 DIAGNOSIS — R002 Palpitations: Secondary | ICD-10-CM

## 2015-02-18 NOTE — Progress Notes (Signed)
   Subjective:    Patient ID: Monica Young, female    DOB: 08/23/1981, 34 y.o.   MRN: [id number]  Palpitations  This is a new problem. The current episode started more than 1 month ago. The problem occurs intermittently. The problem has been unchanged. Associated symptoms include shortness of breath. Pertinent negatives include no chest pain, coughing, diaphoresis, dizziness, numbness, syncope, vomiting or weakness. She has tried nothing for the symptoms.  Hyperlipidemia This is a new problem. This is a new diagnosis. Recent lipid tests were reviewed and are high. Associated symptoms include shortness of breath. Pertinent negatives include no chest pain. The current treatment provides mild improvement of lipids. There are no compliance problems.  There are no known risk factors for coronary artery disease.    Patient here to discuss labs following work physical. The patient went to her gynecologist they did lab work on her they told her overall she had high cholesterol she's also been experiencing some intermittent palpitations that last anywhere from 30 seconds to a minute and never seemed to be very fast one time she had a slight shortness of breath and nothing severe  Review of Systems  Constitutional: Negative for diaphoresis, activity change, appetite change and fatigue.  HENT: Negative for congestion.   Respiratory: Positive for shortness of breath. Negative for cough.   Cardiovascular: Positive for palpitations. Negative for chest pain and syncope.  Gastrointestinal: Negative for vomiting and abdominal pain.  Endocrine: Negative for polydipsia and polyphagia.  Neurological: Negative for dizziness, weakness and numbness.  Psychiatric/Behavioral: Negative for confusion.       Objective:   Physical Exam  Constitutional: She appears well-nourished. No distress.  Cardiovascular: Normal rate, regular rhythm and normal heart sounds.   No murmur heard. Pulmonary/Chest: Effort normal  and breath sounds normal. No respiratory distress.  Musculoskeletal: She exhibits no edema.  Lymphadenopathy:    She has no cervical adenopathy.  Neurological: She is alert. She exhibits normal muscle tone.  Psychiatric: Her behavior is normal.  Vitals reviewed.         Assessment & Plan:  Palpitations-her EKG looks good. Shows no sign of QTC work WPW. She needs a lower the amount of caffeine in her diet in also filled in some stress relievers. In addition to this if she has sustained tach Arty of organ 1-2 minutes she ought to figure out rate certainly follow-up with us go to ER if any emergency situations  Hyperlipidemia not bad enough start medicine recheck lab work again in one years time

## 2015-04-09 ENCOUNTER — Other Ambulatory Visit: Payer: Self-pay | Admitting: Obstetrics and Gynecology

## 2015-04-09 ENCOUNTER — Telehealth: Payer: Self-pay | Admitting: *Deleted

## 2015-04-09 MED ORDER — NORGESTIMATE-ETH ESTRADIOL 0.25-35 MG-MCG PO TABS: 1.0000 | ORAL_TABLET | Freq: Every day | ORAL | Status: DC

## 2015-04-09 NOTE — Telephone Encounter (Signed)
Pt requesting refill for 2 months on the Sprintec, states plans to get the Mirena after 2 months. Dr. Emelda Fear gave verbal order for Sprintec 1 tablet po daily, #30, 2 refills. Pt aware RX completed.

## 2015-05-08 ENCOUNTER — Encounter: Payer: Self-pay | Admitting: Women's Health

## 2015-05-08 ENCOUNTER — Ambulatory Visit (INDEPENDENT_AMBULATORY_CARE_PROVIDER_SITE_OTHER): Payer: BLUE CROSS/BLUE SHIELD | Admitting: Women's Health

## 2015-05-08 VITALS — BP 114/68 | HR 71 | Ht 64.0 in | Wt 131.0 lb

## 2015-05-08 DIAGNOSIS — Z30014 Encounter for initial prescription of intrauterine contraceptive device: Secondary | ICD-10-CM

## 2015-05-08 DIAGNOSIS — Z3202 Encounter for pregnancy test, result negative: Secondary | ICD-10-CM

## 2015-05-08 DIAGNOSIS — Z30430 Encounter for insertion of intrauterine contraceptive device: Secondary | ICD-10-CM | POA: Diagnosis not present

## 2015-05-08 DIAGNOSIS — Z975 Presence of (intrauterine) contraceptive device: Secondary | ICD-10-CM | POA: Insufficient documentation

## 2015-05-08 LAB — POCT URINE PREGNANCY: PREG TEST UR: NEGATIVE

## 2015-05-08 NOTE — Progress Notes (Signed)
Patient ID: Monica Young, female   DOB: 12/21/1980, 34 y.o.   MRN: [id number] Monica Jarredabitha D Nunn is a 34 y.o. year old [id number] Caucasian female who presents for placement of a Mirena IUD. She has had a Mirena IUD before, she reports it was embedded in her uterus and was a difficulty removal.   Patient's last menstrual period was 05/07/2015. BP 114/68 mmHg  Pulse 71  Ht 5\' 4"  (1.626 m)  Wt 131 lb (59.421 kg)  BMI 22.47 kg/m2  LMP 05/07/2015 Last sexual intercourse was 2wks ago, and pregnancy test today was neg, and she is on her period.  The risks and benefits of the method and placement have been thouroughly reviewed with the patient and all questions were answered.  Specifically the patient is aware of failure rate of 10/998, expulsion of the IUD and of possible perforation.  The patient is aware of irregular bleeding due to the method and understands the incidence of irregular bleeding diminishes with time.  Signed copy of informed consent in chart.   Time out was performed.  A graves speculum was placed in the vagina.  The cervix was visualized, prepped using Betadine, and grasped with a single tooth tenaculum. The uterus was found to be anteroflexed and it sounded to 7 cm.  Mirena IUD placed per manufacturer's recommendations.   The strings were trimmed to 3 cm.  Sonogram was performed and the proper placement of the IUD was verified via transvaginal u/s by myself and JAG.   The patient was given post procedure instructions, including signs and symptoms of infection and to check for the strings after each menses or each month, and refraining from intercourse or anything in the vagina for 3 days.  She was given a Mirena care card with date Mirena placed, and date Mirena to be removed.  She is scheduled for a f/u appointment in 4 weeks.  Marge DuncansBooker, Myran Arcia Randall CNM, [facility] 05/08/2015 11:46 AM

## 2015-05-08 NOTE — Patient Instructions (Signed)
 Nothing in vagina for 3 days (no sex, douching, tampons, etc...)  Check your strings once a month to make sure you can feel them, if you are not able to please let us know  If you develop a fever of 100.4 or more in the next few weeks, or if you develop severe abdominal pain, please let us know  Use a backup method of birth control, such as condoms, for 2 weeks   Levonorgestrel intrauterine device (IUD) What is this medicine? LEVONORGESTREL IUD (LEE voe nor jes trel) is a contraceptive (birth control) device. The device is placed inside the uterus by a healthcare professional. It is used to prevent pregnancy and can also be used to treat heavy bleeding that occurs during your period. Depending on the device, it can be used for 3 to 5 years. This medicine may be used for other purposes; ask your health care provider or pharmacist if you have questions. COMMON BRAND NAME(S): LILETTA, Mirena, Skyla What should I tell my health care provider before I take this medicine? They need to know if you have any of these conditions: -abnormal Pap smear -cancer of the breast, uterus, or cervix -diabetes -endometritis -genital or pelvic infection now or in the past -have more than one sexual partner or your partner has more than one partner -heart disease -history of an ectopic or tubal pregnancy -immune system problems -IUD in place -liver disease or tumor -problems with blood clots or take blood-thinners -use intravenous drugs -uterus of unusual shape -vaginal bleeding that has not been explained -an unusual or allergic reaction to levonorgestrel, other hormones, silicone, or polyethylene, medicines, foods, dyes, or preservatives -pregnant or trying to get pregnant -breast-feeding How should I use this medicine? This device is placed inside the uterus by a health care professional. Talk to your pediatrician regarding the use of this medicine in children. Special care may be  needed. Overdosage: If you think you have taken too much of this medicine contact a poison control center or emergency room at once. NOTE: This medicine is only for you. Do not share this medicine with others. What if I miss a dose? This does not apply. What may interact with this medicine? Do not take this medicine with any of the following medications: -amprenavir -bosentan -fosamprenavir This medicine may also interact with the following medications: -aprepitant -barbiturate medicines for inducing sleep or treating seizures -bexarotene -griseofulvin -medicines to treat seizures like carbamazepine, ethotoin, felbamate, oxcarbazepine, phenytoin, topiramate -modafinil -pioglitazone -rifabutin -rifampin -rifapentine -some medicines to treat HIV infection like atazanavir, indinavir, lopinavir, nelfinavir, tipranavir, ritonavir -St. John's wort -warfarin This list may not describe all possible interactions. Give your health care provider a list of all the medicines, herbs, non-prescription drugs, or dietary supplements you use. Also tell them if you smoke, drink alcohol, or use illegal drugs. Some items may interact with your medicine. What should I watch for while using this medicine? Visit your doctor or health care professional for regular check ups. See your doctor if you or your partner has sexual contact with others, becomes HIV positive, or gets a sexual transmitted disease. This product does not protect you against HIV infection (AIDS) or other sexually transmitted diseases. You can check the placement of the IUD yourself by reaching up to the top of your vagina with clean fingers to feel the threads. Do not pull on the threads. It is a good habit to check placement after each menstrual period. Call your doctor right away if you feel   more of the IUD than just the threads or if you cannot feel the threads at all. The IUD may come out by itself. You may become pregnant if the device  comes out. If you notice that the IUD has come out use a backup birth control method like condoms and call your health care provider. Using tampons will not change the position of the IUD and are okay to use during your period. What side effects may I notice from receiving this medicine? Side effects that you should report to your doctor or health care professional as soon as possible: -allergic reactions like skin rash, itching or hives, swelling of the face, lips, or tongue -fever, flu-like symptoms -genital sores -high blood pressure -no menstrual period for 6 weeks during use -pain, swelling, warmth in the leg -pelvic pain or tenderness -severe or sudden headache -signs of pregnancy -stomach cramping -sudden shortness of breath -trouble with balance, talking, or walking -unusual vaginal bleeding, discharge -yellowing of the eyes or skin Side effects that usually do not require medical attention (report to your doctor or health care professional if they continue or are bothersome): -acne -breast pain -change in sex drive or performance -changes in weight -cramping, dizziness, or faintness while the device is being inserted -headache -irregular menstrual bleeding within first 3 to 6 months of use -nausea This list may not describe all possible side effects. Call your doctor for medical advice about side effects. You may report side effects to FDA at 1-800-FDA-1088. Where should I keep my medicine? This does not apply. NOTE: This sheet is a summary. It may not cover all possible information. If you have questions about this medicine, talk to your doctor, pharmacist, or health care provider.  2015, Elsevier/Gold Standard. (2011-11-12 13:54:04)  

## 2015-05-22 ENCOUNTER — Ambulatory Visit (INDEPENDENT_AMBULATORY_CARE_PROVIDER_SITE_OTHER): Payer: BLUE CROSS/BLUE SHIELD | Admitting: Women's Health

## 2015-05-22 ENCOUNTER — Encounter: Payer: Self-pay | Admitting: Women's Health

## 2015-05-22 VITALS — BP 92/62 | HR 80 | Ht 63.5 in | Wt 130.0 lb

## 2015-05-22 DIAGNOSIS — Z01419 Encounter for gynecological examination (general) (routine) without abnormal findings: Secondary | ICD-10-CM

## 2015-05-22 DIAGNOSIS — Z30431 Encounter for routine checking of intrauterine contraceptive device: Secondary | ICD-10-CM

## 2015-05-22 NOTE — Patient Instructions (Signed)
.   Mediterranean style diet:  (for cholesterol) o High in fruits, vegs, whole grains, beans, nuts, seeds, olive oil o Low-Mod fish, poultry, dairy o Little red meat o

## 2015-05-22 NOTE — Progress Notes (Signed)
Patient ID: Monica Young, female   DOB: 1981-10-03, 34 y.o.   MRN: [id number] Subjective:   Monica Young is a [age] y.o. G85P2012 Caucasian female here for a routine well-woman exam.  Patient's last menstrual period was 05/07/2015.    Mirena IUD placed 2wks ago, no problems other than was unable to feel strings when checking Current complaints: none PCP: Dr. Gerda Diss       Does not desire labs, had labs recently for husband's insurance all normal except cholesterol slightly elevated- she has already discussed w/ Dr. Gerda Diss  Social History: Sexual: heterosexual Marital Status: married Living situation: with spouse Occupation: Engineer, civil (consulting) at [employer] Tobacco/alcohol: no tobacco or etoh Illicit drugs: no history of illicit drug use  The following portions of the patient's history were reviewed and updated as appropriate: allergies, current medications, past family history, past medical history, past social history, past surgical history and problem list.  Past Medical History Past Medical History  Diagnosis Date  . IBS (irritable bowel syndrome)     Past Surgical History History reviewed. No pertinent past surgical history.  Gynecologic History [id number]  Patient's last menstrual period was 05/07/2015. Contraception: IUD Last Pap: 2015. Results were: normal w/ -HRHPV Last mammogram: never. Results were: n/a Last TCS: never  Obstetric History OB History  Gravida Para Term Preterm AB SAB TAB Ectopic Multiple Living  2 2 2       2     # Outcome Date GA Lbr Len/2nd Weight Sex Delivery Anes PTL Lv  2 Term 01/03/09 [redacted]w[redacted]d  7 lb 11 oz (3.487 kg) M Vag-Spont EPI  Y  1 Term 09/27/07 [redacted]w[redacted]d  7 lb 15 oz (3.6 kg) F Vag-Spont EPI  Y      Current Medications Current Outpatient Prescriptions on File Prior to Visit  Medication Sig Dispense Refill  . Multiple Vitamin (MULTIVITAMIN) capsule Take 1 capsule by mouth daily.    . mupirocin ointment (BACTROBAN) 2 % Apply to affected area 3  times daily (Patient not taking: Reported on 05/08/2015) 22 g 0  . norgestimate-ethinyl estradiol (ORTHO-CYCLEN,SPRINTEC,PREVIFEM) 0.25-35 MG-MCG tablet Take 1 tablet by mouth daily. (Patient not taking: Reported on 05/08/2015) 1 Package 11  . norgestimate-ethinyl estradiol (ORTHO-CYCLEN,SPRINTEC,PREVIFEM) 0.25-35 MG-MCG tablet Take 1 tablet by mouth daily. (Patient not taking: Reported on 05/08/2015) 1 Package 2  . ondansetron (ZOFRAN ODT) 4 MG disintegrating tablet Take 1 tablet (4 mg total) by mouth every 8 (eight) hours as needed for nausea or vomiting. (Patient not taking: Reported on 05/08/2015) 20 tablet 0   No current facility-administered medications on file prior to visit.    Review of Systems Patient denies any headaches, blurred vision, shortness of breath, chest pain, abdominal pain, problems with bowel movements, urination, or intercourse.  Objective:  BP 92/62 mmHg  Pulse 80  Ht 5' 3.5" (1.613 m)  Wt 130 lb (58.968 kg)  BMI 22.66 kg/m2  LMP 05/07/2015 Physical Exam  General:  Well developed, well nourished, no acute distress. She is alert and oriented x3. Skin:  Warm and dry Neck:  Midline trachea, no thyromegaly or nodules Cardiovascular: Regular rate and rhythm, no murmur heard Lungs:  Effort normal, all lung fields clear to auscultation bilaterally Breasts:  No dominant palpable mass, retraction, or nipple discharge Abdomen:  Soft, non tender, no hepatosplenomegaly or masses Pelvic:  External genitalia is normal in appearance.  The vagina is normal in appearance. The cervix is bulbous, no CMT.  Mirena strings visible. Thin prep pap is not done  Uterus is felt to be normal size, shape, and contour.  No adnexal masses or tenderness noted. Extremities:  No swelling or varicosities noted Psych:  She has a normal mood and affect  Assessment:   Healthy well-woman exam IUD check  Plan:  F/U 64yr for physical, or sooner if needed Check IUD strings monthly Mammogram  or  sooner if problems Colonoscopy  or sooner if problems  Marge Duncans CNM, [facility] 05/22/2015 11:02 AM

## 2015-09-23 ENCOUNTER — Encounter: Payer: Self-pay | Admitting: Family Medicine

## 2015-09-23 ENCOUNTER — Ambulatory Visit (INDEPENDENT_AMBULATORY_CARE_PROVIDER_SITE_OTHER): Payer: BLUE CROSS/BLUE SHIELD | Admitting: Family Medicine

## 2015-09-23 VITALS — Temp 98.6°F | Ht 64.0 in | Wt 136.0 lb

## 2015-09-23 DIAGNOSIS — J019 Acute sinusitis, unspecified: Secondary | ICD-10-CM

## 2015-09-23 DIAGNOSIS — B9689 Other specified bacterial agents as the cause of diseases classified elsewhere: Secondary | ICD-10-CM

## 2015-09-23 MED ORDER — FLUCONAZOLE 150 MG PO TABS: 150.0000 mg | ORAL_TABLET | Freq: Once | ORAL | Status: DC

## 2015-09-23 MED ORDER — CEFPROZIL 500 MG PO TABS: 500.0000 mg | ORAL_TABLET | Freq: Two times a day (BID) | ORAL | Status: DC

## 2015-09-23 NOTE — Progress Notes (Signed)
   Subjective:    Patient ID: Zenovia Jarredabitha D Worton, female    DOB: 04/16/1981, 34 y.o.   MRN: [id number]  Cough This is a new problem. The current episode started in the past 7 days. Associated symptoms include headaches, nasal congestion and a sore throat. Pertinent negatives include no chills or fever. She has tried OTC cough suppressant (decongestants) for the symptoms.    Started off more so with a head cold then progressed into sinus pressure pain discomfort drainage.  Review of Systems  Constitutional: Negative for fever, chills, diaphoresis and fatigue.  HENT: Positive for sore throat.   Respiratory: Positive for cough.   Gastrointestinal: Positive for nausea.  Neurological: Positive for headaches.       Objective:   Physical Exam  Constitutional: She appears well-developed.  HENT:  Head: Normocephalic.  Nose: Nose normal.  Mouth/Throat: Oropharynx is clear and moist. No oropharyngeal exudate.  Neck: Neck supple.  Cardiovascular: Normal rate and normal heart sounds.   No murmur heard. Pulmonary/Chest: Effort normal and breath sounds normal. She has no wheezes.  Lymphadenopathy:    She has no cervical adenopathy.  Skin: Skin is warm and dry.  Nursing note and vitals reviewed.         Assessment & Plan:  Viral syndrome Secondary rhinosinusitis Antibiotics prescribed warning signs discussed follow-up if problems.

## 2016-05-08 ENCOUNTER — Ambulatory Visit (INDEPENDENT_AMBULATORY_CARE_PROVIDER_SITE_OTHER): Payer: BLUE CROSS/BLUE SHIELD | Admitting: Family Medicine

## 2016-05-08 ENCOUNTER — Encounter: Payer: Self-pay | Admitting: Family Medicine

## 2016-05-08 VITALS — BP 110/72 | Ht 64.0 in | Wt 136.1 lb

## 2016-05-08 DIAGNOSIS — F411 Generalized anxiety disorder: Secondary | ICD-10-CM | POA: Diagnosis not present

## 2016-05-08 DIAGNOSIS — F320 Major depressive disorder, single episode, mild: Secondary | ICD-10-CM | POA: Diagnosis not present

## 2016-05-08 MED ORDER — SERTRALINE HCL 50 MG PO TABS: 50.0000 mg | ORAL_TABLET | Freq: Every day | ORAL | Status: DC

## 2016-05-08 NOTE — Progress Notes (Signed)
   Subjective:    Patient ID: Monica Young, female    DOB: 08/16/1981, 35 y.o.   MRN: [id number]  Anxiety Presents for initial visit. Onset was 6 to 12 months ago. The problem has been gradually worsening. Symptoms include depressed mood, excessive worry, nervous/anxious behavior and palpitations. Primary symptoms comment: abdominal pain.    Patient denies being suicidal. Depression questionnaire was gone through. Patient does have symptoms of depression. 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 She is not suicidal or homicidal. She does have a lot of anxiousness and anxiety symptoms. And also has a tendency toward OCD and wondering everything to be a particular way   Patient states no other concerns this visit.   Review of Systems  Cardiovascular: Positive for palpitations.  Psychiatric/Behavioral: The patient is nervous/anxious.        Objective:   Physical Exam  On physical exam lungs clear heart regular      Assessment & Plan:  Patient does have combination of anxiety depression along with OCD tendencies. I do not feel the patient is suicidal. We talked at length about various treatments she is RD done counseling before. She does not want to do counseling currently. She does try to use various techniques to help alleviate stress. She is agreeing to trying Zoloft. She tried before in the past did help. Start off 50 mg tablet. Half tablet daily for the first week then one daily thereafter. Follow-up in approximately 3 weeks. Become suicidal or dysfunction or severe depression immediately stop medicine and notify us.

## 2016-06-02 ENCOUNTER — Encounter: Payer: Self-pay | Admitting: Family Medicine

## 2016-06-02 ENCOUNTER — Ambulatory Visit (INDEPENDENT_AMBULATORY_CARE_PROVIDER_SITE_OTHER): Payer: BLUE CROSS/BLUE SHIELD | Admitting: Family Medicine

## 2016-06-02 VITALS — BP 122/82 | Ht 64.0 in | Wt 133.4 lb

## 2016-06-02 DIAGNOSIS — F329 Major depressive disorder, single episode, unspecified: Secondary | ICD-10-CM

## 2016-06-02 DIAGNOSIS — F418 Other specified anxiety disorders: Secondary | ICD-10-CM | POA: Diagnosis not present

## 2016-06-02 DIAGNOSIS — F32A Depression, unspecified: Secondary | ICD-10-CM

## 2016-06-02 DIAGNOSIS — F419 Anxiety disorder, unspecified: Principal | ICD-10-CM

## 2016-06-02 MED ORDER — SERTRALINE HCL 50 MG PO TABS: 50.0000 mg | ORAL_TABLET | Freq: Every day | ORAL | 5 refills | Status: DC

## 2016-06-02 NOTE — Progress Notes (Signed)
   Subjective:    Patient ID: Monica Young, female    DOB: 08/14/1981, 35 y.o.   MRN: [id number]  HPI Patient arrives for a follow up on anxiety. Patient states she is doing better Patient denies depression currently states anxiety is doing better her moods are doing better she doesn't feel depressed currently  Review of Systems Denies any chest tightness pressure pain denies suicidal ideation   denies any side effects or medication Objective:   Physical Exam   Lungs clear heart regular     Assessment & Plan:  Anxiety related issues are doing better on medication continue that she will message S4 weeks time regarding how she is doing she will follow-up within 4-5 months she was warned that if she starts feeling suicidal depressed or other symptoms immediately follow-up

## 2016-07-08 DIAGNOSIS — L301 Dyshidrosis [pompholyx]: Secondary | ICD-10-CM | POA: Diagnosis not present

## 2016-10-18 ENCOUNTER — Other Ambulatory Visit: Payer: Self-pay | Admitting: Family Medicine

## 2017-08-11 ENCOUNTER — Ambulatory Visit (HOSPITAL_COMMUNITY)
Admission: RE | Admit: 2017-08-11 | Discharge: 2017-08-11 | Disposition: A | Discharge status: Home / Self Care | Payer: BLUE CROSS/BLUE SHIELD | Source: Ambulatory Visit | Attending: Family Medicine | Admitting: Family Medicine

## 2017-08-11 ENCOUNTER — Encounter: Payer: Self-pay | Admitting: Family Medicine

## 2017-08-11 ENCOUNTER — Ambulatory Visit (INDEPENDENT_AMBULATORY_CARE_PROVIDER_SITE_OTHER): Payer: BLUE CROSS/BLUE SHIELD | Admitting: Family Medicine

## 2017-08-11 VITALS — BP 114/70 | Ht 64.0 in | Wt 135.0 lb

## 2017-08-11 DIAGNOSIS — W109XXA Fall (on) (from) unspecified stairs and steps, initial encounter: Secondary | ICD-10-CM | POA: Insufficient documentation

## 2017-08-11 DIAGNOSIS — M79672 Pain in left foot: Secondary | ICD-10-CM | POA: Diagnosis not present

## 2017-08-11 DIAGNOSIS — S99912A Unspecified injury of left ankle, initial encounter: Secondary | ICD-10-CM | POA: Diagnosis not present

## 2017-08-11 DIAGNOSIS — S93402A Sprain of unspecified ligament of left ankle, initial encounter: Secondary | ICD-10-CM | POA: Diagnosis not present

## 2017-08-11 DIAGNOSIS — S99922A Unspecified injury of left foot, initial encounter: Secondary | ICD-10-CM | POA: Diagnosis not present

## 2017-08-11 DIAGNOSIS — Z23 Encounter for immunization: Secondary | ICD-10-CM | POA: Diagnosis not present

## 2017-08-11 NOTE — Progress Notes (Signed)
   Subjective:    Patient ID: Monica Young, female    DOB: 11/19/1980, 36 y.o.   MRN: [id number]  HPI Patient is here today with complaints of  Left foot pain. She reports she fell of the steps this am around 11am  And rolled her ankle. She says she was fine it hurt aliitle,but did not start having troubles with not being able to stand and put pressure on it until around 3pm. Would like the flu shot while here. Took Ibuprofen after it happened.  Patient accidentally rolled her foot falling down some steps complains pain discomfort unable to put pressure on it never had this problem before Review of Systems Patient denies knee pain calf pain relates ankle pain foot pain difficulty walking difficulty putting pressure on it    Objective:   Physical Exam  Knee is normal calf is normal ankle moderate tenderness anteriorly ligaments tender lateral ligaments tender foot normal      Assessment & Plan:  Ankle pain Anti-inflammatories Use crutches X-rays indicated await results

## 2017-12-17 ENCOUNTER — Encounter: Payer: BLUE CROSS/BLUE SHIELD | Admitting: Nurse Practitioner

## 2017-12-20 ENCOUNTER — Encounter: Payer: Self-pay | Admitting: Nurse Practitioner

## 2017-12-20 ENCOUNTER — Ambulatory Visit (INDEPENDENT_AMBULATORY_CARE_PROVIDER_SITE_OTHER): Payer: BLUE CROSS/BLUE SHIELD | Admitting: Nurse Practitioner

## 2017-12-20 VITALS — BP 110/74 | Ht 64.0 in | Wt 143.6 lb

## 2017-12-20 DIAGNOSIS — Z01419 Encounter for gynecological examination (general) (routine) without abnormal findings: Secondary | ICD-10-CM

## 2017-12-20 DIAGNOSIS — Z1151 Encounter for screening for human papillomavirus (HPV): Secondary | ICD-10-CM

## 2017-12-20 DIAGNOSIS — Z13220 Encounter for screening for lipoid disorders: Secondary | ICD-10-CM | POA: Diagnosis not present

## 2017-12-20 DIAGNOSIS — Z124 Encounter for screening for malignant neoplasm of cervix: Secondary | ICD-10-CM

## 2017-12-20 NOTE — Progress Notes (Signed)
Subjective:    Patient ID: Monica Young, female    DOB: [birth year], 37 y.o.   MRN: [id number]  HPI presents for her wellness exam.  Has an IUD/Mirena for birth control.  Has not had any vaginal bleeding.  Same sexual partner.  Slight weight gain due to increase in nighttime eating.  Very active.  Regular vision and dental exams.   Review of Systems  Constitutional: Negative for activity change, appetite change and fatigue.  HENT: Negative for dental problem, ear pain, sinus pressure and sore throat.   Respiratory: Negative for cough, chest tightness, shortness of breath and wheezing.   Cardiovascular: Negative for chest pain.  Gastrointestinal: Positive for diarrhea. Negative for abdominal distention, abdominal pain, constipation, nausea and vomiting.       Occasional diarrhea associated with IBS; no current problem.   Genitourinary: Negative for difficulty urinating, dysuria, enuresis, frequency, genital sores, menstrual problem, pelvic pain, urgency, vaginal bleeding and vaginal discharge.  Psychiatric/Behavioral: Positive for sleep disturbance.       Difficulty going to sleep then sleeps all night.    Depression screen [facility] 2/9 12/20/2017 05/08/2016  Decreased Interest 0 2  Down, Depressed, Hopeless 0 2  PHQ - 2 Score 0 4  Altered sleeping - 0  Tired, decreased energy - 2  Change in appetite - 2  Feeling bad or failure about yourself  - 0  Trouble concentrating - 0  Moving slowly or fidgety/restless - 1  Suicidal thoughts - 0  PHQ-9 Score - 9  Difficult doing work/chores - Somewhat difficult   Has stopped her Zoloft; defers need for more medication at this time.     Objective:   Physical Exam  Constitutional: She is oriented to person, place, and time. She appears well-developed. No distress.  HENT:  Right Ear: External ear normal.  Left Ear: External ear normal.  Mouth/Throat: Oropharynx is clear and moist.  Neck: Normal range of motion. Neck supple. No tracheal  deviation present. No thyromegaly present.  Cardiovascular: Normal rate, regular rhythm and normal heart sounds. Exam reveals no gallop.  No murmur heard. Pulmonary/Chest: Effort normal and breath sounds normal. Right breast exhibits no inverted nipple, no mass, no skin change and no tenderness. Left breast exhibits no inverted nipple, no mass, no skin change and no tenderness. Breasts are symmetrical.  Axillae no adenopathy.  Abdominal: Soft. She exhibits no distension. There is no tenderness.  Genitourinary: Vagina normal and uterus normal. No vaginal discharge found.  Genitourinary Comments: External GU: No rashes or lesions.  Vagina minimal amount of white mucoid discharge.  Cervix normal in appearance, a tiny part of the string can be noted at the office.  No CMT.  Bimanual exam no tenderness or obvious masses.  Musculoskeletal: She exhibits no edema.  Lymphadenopathy:    She has no cervical adenopathy.  Neurological: She is alert and oriented to person, place, and time.  Skin: Skin is warm and dry. No rash noted.  Psychiatric: She has a normal mood and affect. Her behavior is normal.  Vitals reviewed.         Assessment & Plan:  Well woman exam - Plan: Pap IG and HPV (high risk) DNA detection, CBC with Differential, Basic Metabolic Panel (BMET), Lipid Profile, Hepatic function panel, TSH, Vitamin D (25 hydroxy)  Screening for cervical cancer - Plan: Pap IG and HPV (high risk) DNA detection  Screening for HPV (human papillomavirus) - Plan: Pap IG and HPV (high risk) DNA detection  Need for  lipid screening - Plan: Lipid Profile  Encouraged continued activity.  Recommend patient avoid eating late at night particularly certain foods.  Discussed healthy diet. Add melatonin as directed for difficulty going to sleep. Return in about 1 year (around 12/20/2018) for physical.

## 2017-12-22 DIAGNOSIS — Z01419 Encounter for gynecological examination (general) (routine) without abnormal findings: Secondary | ICD-10-CM | POA: Diagnosis not present

## 2017-12-22 DIAGNOSIS — Z13220 Encounter for screening for lipoid disorders: Secondary | ICD-10-CM | POA: Diagnosis not present

## 2017-12-22 LAB — PAP IG AND HPV HIGH-RISK
HPV, HIGH-RISK: NEGATIVE
PAP Smear Comment: 0

## 2017-12-23 LAB — CBC WITH DIFFERENTIAL/PLATELET
BASOS: 0 %
Basophils Absolute: 0 10*3/uL (ref 0.0–0.2)
EOS (ABSOLUTE): 0 10*3/uL (ref 0.0–0.4)
EOS: 1 %
HEMATOCRIT: 38.3 % (ref 34.0–46.6)
Hemoglobin: 13.4 g/dL (ref 11.1–15.9)
Immature Grans (Abs): 0 10*3/uL (ref 0.0–0.1)
Immature Granulocytes: 0 %
LYMPHS ABS: 1.8 10*3/uL (ref 0.7–3.1)
Lymphs: 38 %
MCH: 29.2 pg (ref 26.6–33.0)
MCHC: 35 g/dL (ref 31.5–35.7)
MCV: 83 fL (ref 79–97)
MONOS ABS: 0.4 10*3/uL (ref 0.1–0.9)
Monocytes: 9 %
NEUTROS ABS: 2.5 10*3/uL (ref 1.4–7.0)
Neutrophils: 52 %
PLATELETS: 252 10*3/uL (ref 150–379)
RBC: 4.59 x10E6/uL (ref 3.77–5.28)
RDW: 13.5 % (ref 12.3–15.4)
WBC: 4.9 10*3/uL (ref 3.4–10.8)

## 2017-12-23 LAB — BASIC METABOLIC PANEL
BUN / CREAT RATIO: 18 (ref 9–23)
BUN: 11 mg/dL (ref 6–20)
CALCIUM: 9.2 mg/dL (ref 8.7–10.2)
CHLORIDE: 103 mmol/L (ref 96–106)
CO2: 23 mmol/L (ref 20–29)
Creatinine, Ser: 0.62 mg/dL (ref 0.57–1.00)
GFR, EST AFRICAN AMERICAN: 135 mL/min/{1.73_m2} (ref >59)
GFR, EST NON AFRICAN AMERICAN: 117 mL/min/{1.73_m2} (ref >59)
Glucose: 86 mg/dL (ref 65–99)
POTASSIUM: 3.9 mmol/L (ref 3.5–5.2)
Sodium: 142 mmol/L (ref 134–144)

## 2017-12-23 LAB — LIPID PANEL
CHOL/HDL RATIO: 3.7 ratio (ref 0.0–4.4)
Cholesterol, Total: 159 mg/dL (ref 100–199)
HDL: 43 mg/dL (ref >39)
LDL Calculated: 106 mg/dL — ABNORMAL HIGH (ref 0–99)
Triglycerides: 51 mg/dL (ref 0–149)
VLDL CHOLESTEROL CAL: 10 mg/dL (ref 5–40)

## 2017-12-23 LAB — HEPATIC FUNCTION PANEL
ALBUMIN: 4.7 g/dL (ref 3.5–5.5)
ALK PHOS: 50 IU/L (ref 39–117)
ALT: 24 IU/L (ref 0–32)
AST: 21 IU/L (ref 0–40)
BILIRUBIN, DIRECT: 0.16 mg/dL (ref 0.00–0.40)
Bilirubin Total: 0.6 mg/dL (ref 0.0–1.2)
TOTAL PROTEIN: 7.1 g/dL (ref 6.0–8.5)

## 2017-12-23 LAB — VITAMIN D 25 HYDROXY (VIT D DEFICIENCY, FRACTURES): VIT D 25 HYDROXY: 35.4 ng/mL (ref 30.0–100.0)

## 2017-12-23 LAB — TSH: TSH: 0.838 u[IU]/mL (ref 0.450–4.500)

## 2018-09-01 ENCOUNTER — Telehealth: Payer: Self-pay | Admitting: Family Medicine

## 2018-09-01 NOTE — Telephone Encounter (Signed)
Physical form dropped off placed in Dr.Scott's box.

## 2018-09-02 NOTE — Telephone Encounter (Signed)
Form was completed as requested 

## 2018-12-22 ENCOUNTER — Ambulatory Visit (INDEPENDENT_AMBULATORY_CARE_PROVIDER_SITE_OTHER): Payer: BLUE CROSS/BLUE SHIELD | Admitting: Family Medicine

## 2018-12-22 ENCOUNTER — Telehealth: Payer: Self-pay | Admitting: Family Medicine

## 2018-12-22 ENCOUNTER — Encounter: Payer: Self-pay | Admitting: Family Medicine

## 2018-12-22 VITALS — BP 138/78 | Ht 63.0 in | Wt 135.2 lb

## 2018-12-22 DIAGNOSIS — G43009 Migraine without aura, not intractable, without status migrainosus: Secondary | ICD-10-CM | POA: Diagnosis not present

## 2018-12-22 DIAGNOSIS — Z0001 Encounter for general adult medical examination with abnormal findings: Secondary | ICD-10-CM | POA: Diagnosis not present

## 2018-12-22 MED ORDER — RIZATRIPTAN BENZOATE 10 MG PO TABS: 10.0000 mg | ORAL_TABLET | ORAL | 0 refills | Status: DC | PRN

## 2018-12-22 NOTE — Telephone Encounter (Signed)
PA needed for rizatriptan 10 mg tablet. PA sent to plan today. Awaiting decision from insurance.
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
## 2018-12-29 NOTE — Telephone Encounter (Signed)
Patient's insurance allows 9 pills per 30 days with a $22  copay and patient picked up 9 pills 12/22/2018

## 2019-01-19 ENCOUNTER — Ambulatory Visit: Payer: BLUE CROSS/BLUE SHIELD | Admitting: Family Medicine

## 2019-01-25 ENCOUNTER — Ambulatory Visit (INDEPENDENT_AMBULATORY_CARE_PROVIDER_SITE_OTHER): Payer: BLUE CROSS/BLUE SHIELD | Admitting: Family Medicine

## 2019-01-25 ENCOUNTER — Other Ambulatory Visit: Payer: Self-pay

## 2019-01-25 DIAGNOSIS — G43009 Migraine without aura, not intractable, without status migrainosus: Secondary | ICD-10-CM | POA: Diagnosis not present

## 2019-01-25 MED ORDER — RIZATRIPTAN BENZOATE 10 MG PO TABS: 10.0000 mg | ORAL_TABLET | ORAL | 6 refills | Status: DC | PRN

## 2019-01-25 NOTE — Progress Notes (Signed)
   Subjective:    Patient ID: Monica Young, female    DOB: 11-23-1980, 38 y.o.   MRN: [id number]  HPI  Patient calls for a re check for migraines. Patient states the medication has help a lot with migraines and her migraines have even lessened the several weeks Patient with at least 3-4 migraines per month it is not so bad that she wants to be on a daily medicine she would rather stick with what she is taking intermittently she also states she is under a lot of stress some anxiety related issues but denies being depressed occasional panic related issues but does not want to be on a daily medicine Virtual Visit via Video Note Video visit was completed I connected with Monica Young on 01/25/19 at 10:00 AM EDT by a video enabled telemedicine application and verified that I am speaking with the correct person using two identifiers.   I discussed the limitations of evaluation and management by telemedicine and the availability of in person appointments. The patient expressed understanding and agreed to proceed.  History of Present Illness:    Observations/Objective:   Assessment and Plan:   Follow Up Instructions:    I discussed the assessment and treatment plan with the patient. The patient was provided an opportunity to ask questions and all were answered. The patient agreed with the plan and demonstrated an understanding of the instructions.   The patient was advised to call back or seek an in-person evaluation if the symptoms worsen or if the condition fails to improve as anticipated.  I provided 16 minutes of non-face-to-face time during this encounter.      Review of Systems     Objective:   Physical Exam        Assessment & Plan:  Migraines continue current measures we did discuss possibility of Topamax if this becomes more frequent she will keep up with her headaches and send [place] a MyChart update within a month otherwise she will follow-up within 4 to 6 months   Stress related issues hold off on SSRI currently but if she has ongoing symptomatology she will consider this.

## 2019-04-13 ENCOUNTER — Other Ambulatory Visit: Payer: BLUE CROSS/BLUE SHIELD

## 2019-04-13 ENCOUNTER — Telehealth: Payer: Self-pay | Admitting: Family Medicine

## 2019-04-13 ENCOUNTER — Other Ambulatory Visit: Payer: Self-pay

## 2019-04-13 ENCOUNTER — Ambulatory Visit (INDEPENDENT_AMBULATORY_CARE_PROVIDER_SITE_OTHER): Payer: BC Managed Care – PPO | Admitting: Family Medicine

## 2019-04-13 ENCOUNTER — Telehealth: Payer: Self-pay | Admitting: General Practice

## 2019-04-13 ENCOUNTER — Telehealth: Payer: Self-pay | Admitting: *Deleted

## 2019-04-13 DIAGNOSIS — Z20822 Contact with and (suspected) exposure to covid-19: Secondary | ICD-10-CM

## 2019-04-13 DIAGNOSIS — R509 Fever, unspecified: Secondary | ICD-10-CM

## 2019-04-13 DIAGNOSIS — Z20828 Contact with and (suspected) exposure to other viral communicable diseases: Secondary | ICD-10-CM | POA: Diagnosis not present

## 2019-04-13 DIAGNOSIS — R6889 Other general symptoms and signs: Secondary | ICD-10-CM

## 2019-04-13 NOTE — Progress Notes (Signed)
   Subjective:    Patient ID: Monica Young, female    DOB: 1980-12-03, 38 y.o.   MRN: [id number] Telephone visit video not available Cough This is a new problem. The current episode started in the past 7 days. Associated symptoms include a fever, nasal congestion and a sore throat. Associated symptoms comments: nausea.   Patient just found out a friend she was in contact with was positive for covid Patient relates low-grade fever this morning a little bit of body aches relates respiratory symptoms for the past 3 to 4 days did not feel that it was severe recently found out her coworker was positive COVID  Review of Systems  Constitutional: Positive for fever.  HENT: Positive for sore throat.   Respiratory: Positive for cough.    Virtual Visit via Video Note  I connected with Monica Young on 04/13/19 at  2:30 PM EDT by a video enabled telemedicine application and verified that I am speaking with the correct person using two identifiers.  Location: Patient: home Provider: office   I discussed the limitations of evaluation and management by telemedicine and the availability of in person appointments. The patient expressed understanding and agreed to proceed.  History of Present Illness:    Observations/Objective:   Assessment and Plan:   Follow Up Instructions:    I discussed the assessment and treatment plan with the patient. The patient was provided an opportunity to ask questions and all were answered. The patient agreed with the plan and demonstrated an understanding of the instructions.   The patient was advised to call back or seek an in-person evaluation if the symptoms worsen or if the condition fails to improve as anticipated.  I provided 15 minutes of non-face-to-face time during this encounter.        Objective:   Physical Exam  Today's visit was via telephone Physical exam was not possible for this visit       Assessment & Plan:  Coronavirus  warning signs discussed with patient in detail Home quarantine discussed Recommend the patient take the next 7 to 10 days off COVID testing recommended Possibility of a negative test was discussed Once again warning signs discussed follow-up if any problems call us if any issues

## 2019-04-13 NOTE — Telephone Encounter (Signed)
Pt now has a fever of 100.8. she didn't not have one this morning when she originally called.   Pt states she doesn't feel bad though

## 2019-04-13 NOTE — Telephone Encounter (Signed)
Pt has been scheduled for covid testing. Scheduled with pt directly.

## 2019-04-13 NOTE — Telephone Encounter (Signed)
Pt also said her best friends husband tested positive for covid

## 2019-04-13 NOTE — Telephone Encounter (Signed)
Given her symptoms and what is going on this needs to be handled today go ahead and put her on the schedule and let us move forward

## 2019-04-13 NOTE — Telephone Encounter (Signed)
Patient called yesterday about a virtual visit but then changed her mind and waited. Called in today because she is still sick but we have no appts left for today.  I put her on the schedule for a phone visit tomorrow.  Patient is needing a work note for [employer] Wed, Thursday.  She started having symptoms this past weekend but went to work Monday (She is a Merchandiser, retail). She thought she had better stay home since she is having COVID like symptoms. Sorethroat, congestion, slight dry cough, slight h/a's and now woke up this morning with nausea. Was unaware that this was also a symptom. Employer is requesting a note. Wanted to know if she could go ahead and get a work note for the last 3 days and will still do her phone visit in the morning.  Would like to go ahead and get set up for Covid testing as well.

## 2019-04-13 NOTE — Addendum Note (Signed)
Addended by: Dimple Nanas on: 04/13/2019 01:15 PM   Modules accepted: Orders

## 2019-04-13 NOTE — Telephone Encounter (Signed)
Patient was seen virtually COVID testing recommended

## 2019-04-13 NOTE — Telephone Encounter (Signed)
Pt has been scheduled for covid testing.  °Scheduled with pt directly °Pt was referred by Luking, Scott A, MD °

## 2019-04-13 NOTE — Telephone Encounter (Signed)
Pt had virtual visit today and needs covid 19 testing. Please call pt and set up appt for testing. thanks

## 2019-04-14 ENCOUNTER — Ambulatory Visit: Payer: BLUE CROSS/BLUE SHIELD | Admitting: Family Medicine

## 2019-04-14 ENCOUNTER — Telehealth: Payer: Self-pay | Admitting: Family Medicine

## 2019-04-14 NOTE — Telephone Encounter (Signed)
This patient was seen yesterday for COVID related symptoms and was sent for testing.  She is a Marine scientist hospice. We will need to send a work note excusing her from June 16 through June 28 while she is under home quarantine and undergoing test  The patient has requested that this be faxed to hospice with attention to [facility] thank you

## 2019-04-17 ENCOUNTER — Encounter: Payer: Self-pay | Admitting: Family Medicine

## 2019-04-17 NOTE — Telephone Encounter (Signed)
Work note done & faxed to [facility]

## 2019-04-18 ENCOUNTER — Telehealth: Payer: Self-pay | Admitting: Family Medicine

## 2019-04-18 NOTE — Telephone Encounter (Signed)
Calling to check COVID test results.  She said it has been 5 days.  I told her the lab is behind on the results and we will let her know when we have them.

## 2019-04-18 NOTE — Telephone Encounter (Signed)
Results are not ready

## 2019-04-19 LAB — NOVEL CORONAVIRUS, NAA: SARS-CoV-2, NAA: NOT DETECTED

## 2019-04-19 NOTE — Telephone Encounter (Signed)
Patient was notified of negative covid results via covid testing center 04/19/19- see results

## 2019-05-08 ENCOUNTER — Other Ambulatory Visit: Payer: Self-pay

## 2019-05-08 ENCOUNTER — Ambulatory Visit (INDEPENDENT_AMBULATORY_CARE_PROVIDER_SITE_OTHER): Payer: BC Managed Care – PPO | Admitting: Women's Health

## 2019-05-08 ENCOUNTER — Encounter: Payer: Self-pay | Admitting: Women's Health

## 2019-05-08 VITALS — BP 107/72 | HR 94 | Ht 64.0 in

## 2019-05-08 DIAGNOSIS — Z113 Encounter for screening for infections with a predominantly sexual mode of transmission: Secondary | ICD-10-CM

## 2019-05-08 DIAGNOSIS — R3911 Hesitancy of micturition: Secondary | ICD-10-CM | POA: Diagnosis not present

## 2019-05-08 DIAGNOSIS — R350 Frequency of micturition: Secondary | ICD-10-CM | POA: Diagnosis not present

## 2019-05-08 LAB — POCT URINALYSIS DIPSTICK OB
Blood, UA: NEGATIVE
Glucose, UA: NEGATIVE
Leukocytes, UA: NEGATIVE
Nitrite, UA: NEGATIVE

## 2019-05-08 NOTE — Progress Notes (Signed)
   GYN VISIT Patient name: Monica Young MRN [id number]  Date of birth: 1980/12/04 Chief Complaint:   Gynecologic Exam (wants std testing and ?uti)  History of Present Illness:   Monica Young is a 38 y.o. G68P2012 Caucasian female being seen today for report of [place] stand at beach 2wks ago. Wants STD screening. Also not peeing as much as normal, no frequency/dysuria, only goes very small amounts, last night felt like kidneys were hurting. No fever/chills. Sure she is dehydrated, not eating/drinking like normal d/t being stressed.      No LMP recorded. (Menstrual status: Irregular Periods). The current method of family planning is IUD. Mirena inserted 05/08/15 Last pap 12/20/17. Results were:  normal Review of Systems:   Pertinent items are noted in HPI Denies fever/chills, dizziness, headaches, visual disturbances, fatigue, shortness of breath, chest pain, abdominal pain, vomiting, abnormal vaginal discharge/itching/odor/irritation, problems with periods, bowel movements, urination, or intercourse unless otherwise stated above.  Pertinent History Reviewed:  Reviewed past medical,surgical, social, obstetrical and family history.  Reviewed problem list, medications and allergies. Physical Assessment:   Vitals:   05/08/19 1003  BP: 107/72  Pulse: 94  Height: 5\' 4"  (1.626 m)  Body mass index is 23.21 kg/m.       Physical Examination:   General appearance: alert, well appearing, and in no distress  Mental status: alert, oriented to person, place, and time  Skin: warm & dry   Cardiovascular: normal heart rate noted  Respiratory: normal respiratory effort, no distress  Abdomen: soft, non-tender   BACK:  No CVAT   Pelvic: VULVA: normal appearing vulva with no masses, tenderness or lesions, VAGINA: normal appearing vagina with normal color and discharge, no lesions, CERVIX: normal appearing cervix without discharge or lesions, IUD strings visible  Extremities: no edema   Results  for orders placed or performed in visit on 05/08/19 (from the past 24 hour(s))  POC Urinalysis Dipstick OB   Collection Time: 05/08/19 10:09 AM  Result Value Ref Range   Color, UA     Clarity, UA     Glucose, UA Negative Negative   Bilirubin, UA     Ketones, UA large    Spec Grav, UA     Blood, UA neg    pH, UA     POC,PROTEIN,UA Small (1+) Negative, Trace, Small (1+), Moderate (2+), Large (3+), 4+   Urobilinogen, UA     Nitrite, UA neg    Leukocytes, UA Negative Negative   Appearance     Odor      Assessment & Plan:  1) STD screen> nuswab plus, hiv, rpr, repeat in 62mths  2) Oligouria/hesitancy> send urine cx, increase po water intake  Meds: No orders of the defined types were placed in this encounter.   Orders Placed This Encounter  Procedures  . Urine Culture  . HIV Antibody (routine testing w rflx)  . RPR  . NuSwab Vaginitis Plus (VG+)  . POC Urinalysis Dipstick OB    Return in about 3 months (around 08/08/2019) for F/U labs.  [place], [facility] 05/08/2019 10:52 AM

## 2019-05-09 LAB — RPR: RPR Ser Ql: NONREACTIVE

## 2019-05-09 LAB — HIV ANTIBODY (ROUTINE TESTING W REFLEX): HIV Screen 4th Generation wRfx: NONREACTIVE

## 2019-05-10 LAB — URINE CULTURE

## 2019-05-10 LAB — SPECIMEN STATUS REPORT

## 2019-05-12 ENCOUNTER — Telehealth: Payer: Self-pay | Admitting: *Deleted

## 2019-05-12 ENCOUNTER — Telehealth: Payer: Self-pay | Admitting: Women's Health

## 2019-05-12 NOTE — Telephone Encounter (Signed)
Pt calling back checking status °

## 2019-05-12 NOTE — Telephone Encounter (Signed)
Patient called, looking for lab results.  [phone]

## 2019-05-12 NOTE — Telephone Encounter (Signed)
Erroneous encounter. Estill Bamberg to call patient.

## 2019-05-12 NOTE — Telephone Encounter (Signed)
Pt informed that all results not back yet. Nuswab can take 7 days.

## 2019-05-15 LAB — NUSWAB VAGINITIS PLUS (VG+)
Candida albicans, NAA: NEGATIVE
Candida glabrata, NAA: NEGATIVE
Chlamydia trachomatis, NAA: NEGATIVE
Neisseria gonorrhoeae, NAA: NEGATIVE
Trich vag by NAA: NEGATIVE

## 2019-05-15 LAB — SPECIMEN STATUS REPORT

## 2019-05-18 ENCOUNTER — Telehealth: Payer: Self-pay | Admitting: Women's Health

## 2019-05-18 NOTE — Telephone Encounter (Signed)
Pt requesting a call to discuss lab results.  °

## 2019-05-18 NOTE — Telephone Encounter (Signed)
Called patient back and informed her all results negative.

## 2019-06-20 ENCOUNTER — Other Ambulatory Visit: Payer: Self-pay

## 2019-06-20 ENCOUNTER — Telehealth: Payer: Self-pay | Admitting: Women's Health

## 2019-06-20 ENCOUNTER — Other Ambulatory Visit: Payer: BC Managed Care – PPO

## 2019-06-20 DIAGNOSIS — N898 Other specified noninflammatory disorders of vagina: Secondary | ICD-10-CM

## 2019-06-20 NOTE — Telephone Encounter (Signed)
Pt is wanting to come in to be tested for a STD. States she has discharge. Please advise.

## 2019-06-20 NOTE — Progress Notes (Signed)
Pt having a vaginal discharge and wanted testing. NuSwab sent to lab. [place]

## 2019-06-23 LAB — NUSWAB VAGINITIS PLUS (VG+)
Candida albicans, NAA: NEGATIVE
Candida glabrata, NAA: NEGATIVE
Chlamydia trachomatis, NAA: NEGATIVE
Neisseria gonorrhoeae, NAA: NEGATIVE
Trich vag by NAA: NEGATIVE

## 2019-06-26 ENCOUNTER — Telehealth: Payer: Self-pay | Admitting: Women's Health

## 2019-06-26 NOTE — Telephone Encounter (Signed)
Patient called requesting lab results.  [phone]

## 2019-06-26 NOTE — Telephone Encounter (Signed)
Pt informed of swab results,

## 2019-08-05 ENCOUNTER — Encounter: Payer: Self-pay | Admitting: Family Medicine

## 2019-08-07 ENCOUNTER — Other Ambulatory Visit: Payer: Self-pay | Admitting: *Deleted

## 2019-08-07 ENCOUNTER — Other Ambulatory Visit: Payer: BC Managed Care – PPO

## 2019-08-07 MED ORDER — CEPHALEXIN 500 MG PO CAPS: 500.0000 mg | ORAL_CAPSULE | Freq: Four times a day (QID) | ORAL | 0 refills | Status: DC

## 2019-08-07 NOTE — Telephone Encounter (Signed)
Called and discussed with pt and antibiotic sent to [facility]

## 2019-08-07 NOTE — Telephone Encounter (Signed)
Sorry you are having a UTI Our nurses will send in an antibiotic for you If this does not clear up I would recommend an office visit  Nurses Please send in Keflex 500 mg 1 4 times daily for 5 days

## 2019-08-08 ENCOUNTER — Other Ambulatory Visit: Payer: BC Managed Care – PPO

## 2019-08-21 ENCOUNTER — Other Ambulatory Visit (HOSPITAL_COMMUNITY)
Admission: RE | Admit: 2019-08-21 | Discharge: 2019-08-21 | Disposition: A | Discharge status: Home / Self Care | Payer: BC Managed Care – PPO | Source: Ambulatory Visit | Attending: Obstetrics and Gynecology | Admitting: Obstetrics and Gynecology

## 2019-08-21 ENCOUNTER — Other Ambulatory Visit: Payer: BC Managed Care – PPO

## 2019-08-21 ENCOUNTER — Other Ambulatory Visit: Payer: Self-pay

## 2019-08-21 DIAGNOSIS — L299 Pruritus, unspecified: Secondary | ICD-10-CM

## 2019-08-21 NOTE — Progress Notes (Addendum)
   NURSE VISIT- VAGINITIS/STD/POC  SUBJECTIVE:  Monica Young is a 38 y.o. T4S5681 GYN patientfemale here for a vaginal swab for .  She reports the following symptoms:   for itching and creamy white dischargeI sOBJECTIVE:  There were no vitals taken for this visit.  Appears well, in no apparent distress  ASSESSMENT: Vaginal swab for itching,white creamy discharge  PLAN: Self-collected vaginal probe for itching and white creamy discharge.will wait for result.Call back if symptoms worse  Ladonna Snide  08/21/2019 9:32 AM   Chart reviewed for nurse visit. Agree with plan of care.  Roma Schanz, [place] 08/21/2019 1:24 PM

## 2019-08-24 ENCOUNTER — Encounter: Payer: Self-pay | Admitting: *Deleted

## 2019-08-25 LAB — CERVICOVAGINAL ANCILLARY ONLY
Bacterial Vaginitis (gardnerella): NEGATIVE
Candida Glabrata: NEGATIVE
Candida Vaginitis: POSITIVE — AB
Chlamydia: NEGATIVE
Comment: NEGATIVE
Comment: NEGATIVE
Comment: NEGATIVE
Comment: NEGATIVE
Comment: NEGATIVE
Comment: NORMAL
Neisseria Gonorrhea: NEGATIVE
Trichomonas: NEGATIVE

## 2019-08-28 ENCOUNTER — Other Ambulatory Visit: Payer: Self-pay | Admitting: Women's Health

## 2019-08-28 MED ORDER — FLUCONAZOLE 150 MG PO TABS: 150.0000 mg | ORAL_TABLET | Freq: Once | ORAL | 0 refills | Status: AC

## 2019-11-01 ENCOUNTER — Encounter: Payer: Self-pay | Admitting: Advanced Practice Midwife

## 2019-11-01 ENCOUNTER — Other Ambulatory Visit: Payer: Self-pay

## 2019-11-01 ENCOUNTER — Ambulatory Visit: Payer: BC Managed Care – PPO | Admitting: Advanced Practice Midwife

## 2019-11-01 VITALS — BP 112/69 | HR 81 | Ht 64.0 in | Wt 140.0 lb

## 2019-11-01 DIAGNOSIS — N644 Mastodynia: Secondary | ICD-10-CM | POA: Diagnosis not present

## 2019-11-01 NOTE — Progress Notes (Signed)
   GYN VISIT Patient name: Monica Young MRN [id number]  Date of birth: [birth year] Chief Complaint:   right breast pain and sore  History of Present Illness:   Monica Young is a 39 y.o. G24P2012 Caucasian female being seen today for R breast pain (burning sensation outer quad, 7o'clock to 11 o'clock) x 1 month now. Has been pretty consistent, sometimes waking her from sleep.     No LMP recorded. (Menstrual status: IUD). The current method of family planning is IUD.  Last pap May 2019. Results were:  normal Review of Systems:   Pertinent items are noted in HPI Denies fever/chills, dizziness, headaches, visual disturbances, fatigue, shortness of breath, chest pain, abdominal pain, vomiting, abnormal vaginal discharge/itching/odor/irritation, problems with periods, bowel movements, urination, or intercourse unless otherwise stated above.  Pertinent History Reviewed:  Reviewed past medical,surgical, social, obstetrical and family history.  Reviewed problem list, medications and allergies. Physical Assessment:   Vitals:   11/01/19 1529  BP: 112/69  Pulse: 81  Weight: 140 lb (63.5 kg)  Height: 5\' 4"  (1.626 m)  Body mass index is 24.03 kg/m.       Physical Examination:   General appearance: alert, well appearing, and in no distress  Mental status: alert, oriented to person, place, and time  Skin: warm & dry             Breast: bilat, no masses palpated; tenderness from 7 o'clock to 11 o'clock on R outer quad of R breast; no skin irregularities seen  Cardiovascular: normal heart rate noted  Respiratory: normal respiratory effort, no distress  Abdomen: soft, non-tender   Extremities: no edema   Chaperone: n/a    No results found for this or any previous visit (from the past 24 hour(s)).  Assessment & Plan:  1) R breast mastodynia> began about 1 month ago, will get diagnostic U/S & mammo   Meds: No orders of the defined types were placed in this encounter.   Orders Placed  This Encounter  Procedures  . MM Digital Diagnostic Unilat R  . BREAST COMPLETE UNI RIGHT INC AXILLA    No follow-ups on file.  [place] CNM 11/01/2019 4:43 PM

## 2019-11-07 ENCOUNTER — Other Ambulatory Visit: Payer: Self-pay | Admitting: Advanced Practice Midwife

## 2019-11-07 DIAGNOSIS — N644 Mastodynia: Secondary | ICD-10-CM

## 2019-11-14 ENCOUNTER — Ambulatory Visit (HOSPITAL_COMMUNITY): Admission: RE | Admit: 2019-11-14 | Payer: BC Managed Care – PPO | Source: Ambulatory Visit

## 2019-11-14 ENCOUNTER — Other Ambulatory Visit: Payer: Self-pay

## 2019-11-14 ENCOUNTER — Ambulatory Visit (HOSPITAL_COMMUNITY)
Admission: RE | Admit: 2019-11-14 | Discharge: 2019-11-14 | Disposition: A | Discharge status: Home / Self Care | Payer: BC Managed Care – PPO | Source: Ambulatory Visit | Attending: Advanced Practice Midwife | Admitting: Advanced Practice Midwife

## 2019-11-14 DIAGNOSIS — N644 Mastodynia: Secondary | ICD-10-CM | POA: Insufficient documentation

## 2019-11-14 DIAGNOSIS — R922 Inconclusive mammogram: Secondary | ICD-10-CM | POA: Diagnosis not present

## 2020-05-07 ENCOUNTER — Ambulatory Visit: Payer: BC Managed Care – PPO | Admitting: Women's Health

## 2020-05-07 ENCOUNTER — Other Ambulatory Visit: Payer: Self-pay

## 2020-05-24 ENCOUNTER — Other Ambulatory Visit: Payer: Self-pay | Admitting: Family Medicine

## 2020-06-14 ENCOUNTER — Telehealth: Payer: Self-pay | Admitting: Family Medicine

## 2020-06-14 NOTE — Telephone Encounter (Signed)
Pt states last night she had a panic attack for the first time. She was eating dinner at a restaurant with her husband when all of a sudden she had chest tightness (that is coming and going) SOB, and claustrophobic. She had to get out of the restaurant and get some air.   She feels like 2 weeks ago she almost had one she was at home though and had a very suffocated feeling. 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 She is wanting an appt for next week.

## 2020-06-14 NOTE — Telephone Encounter (Signed)
Pt called back, requested OV with Eber Jones, scheduled & pt aware

## 2020-06-14 NOTE — Telephone Encounter (Signed)
lvm to schedule with Clydie Braun

## 2020-06-14 NOTE — Telephone Encounter (Signed)
Please schedule with Clydie Braun or Dr. Lorin Picket next week.

## 2020-06-21 ENCOUNTER — Ambulatory Visit (INDEPENDENT_AMBULATORY_CARE_PROVIDER_SITE_OTHER): Payer: BC Managed Care – PPO | Admitting: Nurse Practitioner

## 2020-06-21 ENCOUNTER — Telehealth: Payer: Self-pay | Admitting: Nurse Practitioner

## 2020-06-21 ENCOUNTER — Encounter: Payer: Self-pay | Admitting: Nurse Practitioner

## 2020-06-21 ENCOUNTER — Other Ambulatory Visit: Payer: Self-pay

## 2020-06-21 VITALS — BP 126/84 | HR 86 | Temp 97.5°F | Wt 144.0 lb

## 2020-06-21 DIAGNOSIS — F419 Anxiety disorder, unspecified: Secondary | ICD-10-CM

## 2020-06-21 DIAGNOSIS — F329 Major depressive disorder, single episode, unspecified: Secondary | ICD-10-CM

## 2020-06-21 DIAGNOSIS — F32A Depression, unspecified: Secondary | ICD-10-CM

## 2020-06-21 MED ORDER — ESCITALOPRAM OXALATE 10 MG PO TABS: 10.0000 mg | ORAL_TABLET | Freq: Every day | ORAL | 0 refills | Status: DC

## 2020-06-21 NOTE — Telephone Encounter (Signed)
Done

## 2020-06-21 NOTE — Progress Notes (Signed)
   Subjective:    Patient ID: Monica Young, female    DOB: 1981-06-26, 39 y.o.   MRN: [id number]  HPI Pt has been having anxiety. Pt states she was on something about 11 years ago for post partum. Pt did not stay on meds long but is now to the point where she feels she needs something.  Unclear about the name of the medicine but she believes it may have been Lexapro.  Pt had a panic attach the other day at supper. Has had a close call once before.  Depression screen [facility] [address] 06/21/2020 12/22/2018 12/20/2017 05/08/2016  Decreased Interest 1 0 0 2  Down, Depressed, Hopeless 1 0 0 2  PHQ - 2 Score 2 0 0 4  Altered sleeping 3 1 - 0  Tired, decreased energy 3 1 - 2  Change in appetite 0 0 - 2  Feeling bad or failure about yourself  1 0 - 0  Trouble concentrating 0 0 - 0  Moving slowly or fidgety/restless 0 0 - 1  Suicidal thoughts 0 0 - 0  PHQ-9 Score 9 2 - 9  Difficult doing work/chores Somewhat difficult Not difficult at all - Somewhat difficult   GAD 7 : Generalized Anxiety Score 06/21/2020  Nervous, Anxious, on Edge 3  Control/stop worrying 3  Worry too much - different things 3  Trouble relaxing 0  Restless 0  Easily annoyed or irritable 3  Afraid - awful might happen 3  Total GAD 7 Score 15  Anxiety Difficulty Not difficult at all   Has trouble going to sleep.  Every 2-3 nights she becomes completely exhausted and will get a good night sleep.  No relief with melatonin.  Has responded well to Tylenol PM. Denies suicidal or homicidal thoughts or ideation. Gets regular preventive health physicals with gynecology.  Review of Systems     Objective:   Physical Exam NAD.  Alert, oriented.  Calm affect.  Making good eye contact.  Dressed appropriately.  Thoughts logical coherent and relevant.  Lungs clear.  Heart regular rate rhythm.       Assessment & Plan:   Problem List Items Addressed This Visit      Other   Anxiety and depression - Primary   Relevant Medications    escitalopram (LEXAPRO) 10 MG tablet     Meds ordered this encounter  Medications  . escitalopram (LEXAPRO) 10 MG tablet    Sig: Take 1 tablet (10 mg total) by mouth daily.    Dispense:  30 tablet    Refill:  0    Order Specific Question:   Supervising Provider    Answer:   Babs Sciara [[id number]]   Start Lexapro 10 mg daily.  Reviewed potential adverse effects.  Patient to DC medication and call office if any significant adverse effects. Discussed importance of stress reduction and adequate rest.  Continue Tylenol PM as directed for sleep. Return in about 1 month (around 07/22/2020) for Recheck on anxiety; virtual or in person. Call back sooner if any problems.

## 2020-06-21 NOTE — Telephone Encounter (Signed)
Patient was seen this morning and was told something would be called in for anxiety and sleep. She checked her pharmacy and nothing was called in. Please advise

## 2020-06-22 ENCOUNTER — Encounter: Payer: Self-pay | Admitting: Nurse Practitioner

## 2020-06-28 ENCOUNTER — Telehealth: Payer: Self-pay | Admitting: Family Medicine

## 2020-06-28 MED ORDER — FLUCONAZOLE 150 MG PO TABS: ORAL_TABLET | ORAL | 0 refills | Status: DC

## 2020-06-28 NOTE — Telephone Encounter (Signed)
Patient is requesting something for yeast infection called into [facility]

## 2020-06-28 NOTE — Telephone Encounter (Signed)
Pt contacted and states that she is having itching and discharge. No pain, bleeding or fever. Diflucan sent in per protocol

## 2020-07-17 ENCOUNTER — Other Ambulatory Visit: Payer: Self-pay | Admitting: Nurse Practitioner

## 2020-07-19 ENCOUNTER — Telehealth: Payer: Self-pay | Admitting: Nurse Practitioner

## 2020-07-19 NOTE — Telephone Encounter (Signed)
Patient is requesting something to be called in for nausea to [facility]

## 2020-07-19 NOTE — Telephone Encounter (Signed)
Sent in refill on Lexapro. Recommend office visit soon for recheck. Virtual is fine.

## 2020-07-19 NOTE — Telephone Encounter (Signed)
Patient has experienced nausea since starting lexapro- she was hoping this would get better but is pretty severe. She has made an appointment to talk about switching to another medicine but doesn't want to stop until she has something else in its place.

## 2020-07-20 ENCOUNTER — Encounter: Payer: Self-pay | Admitting: Nurse Practitioner

## 2020-07-20 ENCOUNTER — Other Ambulatory Visit: Payer: Self-pay | Admitting: Nurse Practitioner

## 2020-07-20 MED ORDER — SERTRALINE HCL 50 MG PO TABS: ORAL_TABLET | ORAL | 0 refills | Status: DC

## 2020-07-26 ENCOUNTER — Telehealth (INDEPENDENT_AMBULATORY_CARE_PROVIDER_SITE_OTHER): Payer: BC Managed Care – PPO | Admitting: Nurse Practitioner

## 2020-07-26 ENCOUNTER — Telehealth: Payer: Self-pay | Admitting: Family Medicine

## 2020-07-26 DIAGNOSIS — F419 Anxiety disorder, unspecified: Secondary | ICD-10-CM | POA: Diagnosis not present

## 2020-07-26 DIAGNOSIS — F32A Depression, unspecified: Secondary | ICD-10-CM

## 2020-07-26 NOTE — Progress Notes (Signed)
° °  Subjective:    Patient ID: Monica Young, female    DOB: 1981-05-06, 39 y.o.   MRN: [id number]  HPI Pt following up on Anxiety. Pt was on Lexapro but was switched to Zoloft 50 mg. Pt is still taking 1/2 tablet and is doing well.  Virtual Visit via Telephone Note  I connected with Monica Young on 07/26/20 at 11:00 AM EDT by telephone and verified that I am speaking with the correct person using two identifiers.  Location: Patient: home Provider: office   I discussed the limitations, risks, security and privacy concerns of performing an evaluation and management service by telephone and the availability of in person appointments. I also discussed with the patient that there may be a patient responsible charge related to this service. The patient expressed understanding and agreed to proceed.   History of Present Illness: Nausea has resolved since stopping Lexapro.  Currently on Zoloft 50 mg half tab daily starting dose, has not increased to 1 tab yet.  Sleeping well.  Appetite improved.  Overall states anxiety is doing much better.  No further panic attacks.  Gets regular preventive health physicals.  Had her flu vaccine yesterday.     Observations/Objective: Today's visit was via telephone Physical exam was not possible for this visit Alert, oriented.  Thoughts logical coherent and relevant.  Assessment and Plan: Problem List Items Addressed This Visit      Other   Anxiety and depression - Primary       Follow Up Instructions: Continue Zoloft as directed.  Call back if any problems with the 50 mg dose.  Otherwise plan to slowly increase the dose if needed.  Call the office in the meantime if any problems.   I discussed the assessment and treatment plan with the patient. The patient was provided an opportunity to ask questions and all were answered. The patient agreed with the plan and demonstrated an understanding of the instructions.   The patient was advised to  call back or seek an in-person evaluation if the symptoms worsen or if the condition fails to improve as anticipated.  I provided 12 minutes of non-face-to-face time during this encounter.       Review of Systems     Objective:   Physical Exam        Assessment & Plan:

## 2020-07-26 NOTE — Telephone Encounter (Signed)
Monica Young, Monica Young are scheduled for a virtual visit with your provider today.    Just as we do with appointments in the office, we must obtain your consent to participate.  Your consent will be active for this visit and any virtual visit you may have with one of our providers in the next 365 days.    If you have a MyChart account, I can also send a copy of this consent to you electronically.  All virtual visits are billed to your insurance company just like a traditional visit in the office.  As this is a virtual visit, video technology does not allow for your provider to perform a traditional examination.  This may limit your provider's ability to fully assess your condition.  If your provider identifies any concerns that need to be evaluated in person or the need to arrange testing such as labs, EKG, etc, we will make arrangements to do so.    Although advances in technology are sophisticated, we cannot ensure that it will always work on either your end or our end.  If the connection with a video visit is poor, we may have to switch to a telephone visit.  With either a video or telephone visit, we are not always able to ensure that we have a secure connection.   I need to obtain your verbal consent now.   Are you willing to proceed with your visit today?   Monica Young has provided verbal consent on 07/26/2020 for a virtual visit (video or telephone).   Marlowe [place], LPN 96/11/2295  98:92 AM

## 2020-07-27 ENCOUNTER — Encounter: Payer: Self-pay | Admitting: Nurse Practitioner

## 2020-08-22 ENCOUNTER — Other Ambulatory Visit: Payer: Self-pay | Admitting: Nurse Practitioner

## 2020-09-30 ENCOUNTER — Other Ambulatory Visit: Payer: Self-pay | Admitting: *Deleted

## 2020-09-30 ENCOUNTER — Telehealth: Payer: Self-pay | Admitting: *Deleted

## 2020-09-30 MED ORDER — ONDANSETRON HCL 8 MG PO TABS: 8.0000 mg | ORAL_TABLET | Freq: Three times a day (TID) | ORAL | 2 refills | Status: DC | PRN

## 2020-09-30 NOTE — Telephone Encounter (Signed)
Med sent to pharm. Left message to return call  

## 2020-09-30 NOTE — Telephone Encounter (Signed)
Zofran 8 mg, 1 tablet taken up to 3 times daily as needed for nausea, #15, 2 refills

## 2020-09-30 NOTE — Telephone Encounter (Signed)
Had nausea last month. Lasted about one month. went away and came back 3 days ago. No abdominal pain, no fever, no vomiting. Wants to know if zofran could be called. Took pregnancy test last month and it was negative. Having some burping with it.   [facility].

## 2020-09-30 NOTE — Telephone Encounter (Signed)
Pt.notified

## 2020-10-07 ENCOUNTER — Ambulatory Visit: Payer: BC Managed Care – PPO | Admitting: Family Medicine

## 2020-10-21 ENCOUNTER — Telehealth: Payer: Self-pay | Admitting: Family Medicine

## 2020-10-21 ENCOUNTER — Ambulatory Visit (INDEPENDENT_AMBULATORY_CARE_PROVIDER_SITE_OTHER): Payer: BC Managed Care – PPO | Admitting: Family Medicine

## 2020-10-21 ENCOUNTER — Encounter: Payer: Self-pay | Admitting: Family Medicine

## 2020-10-21 ENCOUNTER — Other Ambulatory Visit: Payer: Self-pay

## 2020-10-21 VITALS — HR 88 | Temp 97.9°F | Resp 16

## 2020-10-21 DIAGNOSIS — K921 Melena: Secondary | ICD-10-CM | POA: Diagnosis not present

## 2020-10-21 DIAGNOSIS — E860 Dehydration: Secondary | ICD-10-CM | POA: Insufficient documentation

## 2020-10-21 NOTE — Progress Notes (Signed)
Patient ID: Monica Young, female    DOB: 1981-01-20, 39 y.o.   MRN: [id number]   No chief complaint on file.  Subjective:  CC: Positive Covid test today  This is a new problem.  Presents today for an acute outside visit with a complaint of positive Covid test today, she gets tested every week for her work, she was positive today.  She has been having ongoing nausea and vomiting for about a month now, has been treated with Zofran with very little relief, has taking a pregnancy test x4 which were all negative believes that she is dehydrated, suddenly stopped her sertraline about 2 weeks ago thinking that it was what caused her to be nauseated.  She is also had bright red bloody stools, minimal intake of fluids, and has been vomiting everything.  Reports her urine is dark, she was able to keep the Zofran in last night and was able to eat popcorn.  Reports that she has taken OTC Dramamine which has seemed to help better.  She does have a cough as well.  Denies fever, chills, ear pain.  Reports the bright red blood from her stools, is not a new problem, this has happened in the past, and resolved.  Worrisome symptoms include: Nausea, vomiting, dizziness, bright red blood from the rectum, positive Covid test.    nausea and vomiting started about one month ago. zofran not helping that was sent in on December 6th. Having some motion sickness. Has lost about 8 lbs in 2 weeks. About 4 days ago when wiping noticed some bright red blood. This happened twice back in the summer. Pt states she took 4 pregnancy test and all negative. Pt thinks she is dehydrated from vomiting so much. Stopped taking sertraline about 2 weeks ago because she thought it was causing nausea.   Gets tested every Monday for covid and today tested positive today at work.   Medical History Shani has a past medical history of IBS (irritable bowel syndrome).   Outpatient Encounter Medications as of 10/21/2020  Medication Sig  .  levonorgestrel (MIRENA) 20 MCG/24HR IUD 1 each by Intrauterine route once.  . ondansetron (ZOFRAN) 8 MG tablet Take 1 tablet (8 mg total) by mouth every 8 (eight) hours as needed for nausea or vomiting.  . rizatriptan (MAXALT) 10 MG tablet TAKE 1 TABLET BY MOUTH AS NEEDED FOR MIGRAINE. MAY REPEAT IN TWO HOURS IF NEEDED  . sertraline (ZOLOFT) 50 MG tablet Take one tab po qd (Patient not taking: Reported on 10/21/2020)   No facility-administered encounter medications on file as of 10/21/2020.     Review of Systems  Constitutional: Negative for chills and fever.  HENT: Negative for ear pain.   Respiratory: Positive for cough. Negative for shortness of breath.   Cardiovascular: Negative for chest pain.  Gastrointestinal: Positive for blood in stool, nausea and vomiting. Negative for abdominal pain.  Genitourinary:       Reports dark urine, has only urinated once today.  Negative pregnancy test x4.  Psychiatric/Behavioral:       Reports that she suddenly stopped her sertraline about 2 weeks ago due to concerns of causing nausea.     Vitals Pulse 88   Temp 97.9 F (36.6 C)   Resp 16   SpO2 99%   Objective:   Physical Exam Vitals reviewed.  Constitutional:      General: She is not in acute distress.    Appearance: Normal appearance. She is ill-appearing. She is not  toxic-appearing.  HENT:     Right Ear: Tympanic membrane normal.     Left Ear: Tympanic membrane normal.  Cardiovascular:     Rate and Rhythm: Normal rate and regular rhythm.     Heart sounds: Normal heart sounds.  Pulmonary:     Effort: Pulmonary effort is normal.     Breath sounds: Normal breath sounds.  Skin:    General: Skin is warm and dry.  Neurological:     General: No focal deficit present.     Mental Status: She is alert.  Psychiatric:        Behavior: Behavior normal.      Assessment and Plan   1. Blood in stool - IFOBT POC (occult bld, rslt in office); Future - CBC with Differential  2.  Dehydration syndrome - CBC with Differential   Due to reports of inability to keep fluids down, vomiting everything, even her Pedialyte, dark urine, very concerned for dehydration as this has been going on for greater than 1 week.  It is reassuring that she is not tachycardic today and her oxygen saturations were 99%.  Very concerned for hydration status.  Recommend go to UC or ED now for possible IVF secondary unable to keep fluids down for > 1 week.   Due to the report of bright red blood from her rectum, will check a CBC for anemia, and she will return stool sample to be tested for occult blood.  If the stool is positive, she will require a GI referral.  Update: Went to [facility] emergency department, was told that the wait would be a minimal of 9 hours, she called back to the office spoke with the triage nurse, requested lab work, and reports that she will drink the Pedialyte to try to rehydrate herself.  She will get a CBC tomorrow we will notify her once these results are available.  We will touch base with her to find out how she is doing.  We will encourage her to return the stool sample as soon as possible.  Covid-19 respiratory warning:  Covid-19 is a virus that causes hypoxia (low oxygen level in blood) in some people. If you develop any changes in your usual breathing pattern: difficulty catching your breath, more short winded with activity or with resting, or anything that concerns you about your breathing, do not hesitate to go to the emergency department immediately for evaluation. Please do not delay to get treatment.    Agrees with plan of care discussed today. Understands warning signs to seek further care: Chest pain, shortness of breath, any significant change in health.  COVID-19 respiratory warning as above given. Understands to get CBC drawn and return stool for occult blood.  We will notify her once these results are available, and next steps can be discussed at that  time.   Dorena Bodo, FNP-C 10/21/2020

## 2020-10-21 NOTE — Telephone Encounter (Signed)
Pt recently seen and was sent to ER for fluids. Pt called in states that ER is a minimum of 9 hour wait. Pt wanting to know if provider will order lab work that she can have done at [facility]. Pt states she is going to go home and try to drink Pedialyte.   Spoke with Clydie Braun and Clydie Braun agreed to order CBC but wanting to know how is pt going to keep Purple Pedialyte down?  Contacted pt and pt states that she is going to try to keep it down. Advised pt to go to Urgent Care if unable to keep fluids down. Pt verbalized understanding.  CBC ordered.

## 2020-10-22 ENCOUNTER — Ambulatory Visit: Payer: BC Managed Care – PPO | Admitting: Nurse Practitioner

## 2020-10-22 ENCOUNTER — Other Ambulatory Visit: Payer: Self-pay | Admitting: Family Medicine

## 2020-10-22 ENCOUNTER — Telehealth: Payer: Self-pay | Admitting: Family Medicine

## 2020-10-22 DIAGNOSIS — T753XXA Motion sickness, initial encounter: Secondary | ICD-10-CM | POA: Insufficient documentation

## 2020-10-22 DIAGNOSIS — E860 Dehydration: Secondary | ICD-10-CM | POA: Diagnosis not present

## 2020-10-22 MED ORDER — MECLIZINE HCL 25 MG PO TABS: ORAL_TABLET | ORAL | 0 refills | Status: DC

## 2020-10-22 NOTE — Telephone Encounter (Signed)
Pt calling to see if provider will sent in Phenergan to [facility]. Pt was seen yesterday. Had lab work done today. Pt states she feels as if this is motion sickness. Please advise. Thank you.

## 2020-10-23 LAB — CBC WITH DIFFERENTIAL/PLATELET
Basophils Absolute: 0 10*3/uL (ref 0.0–0.2)
Basos: 0 %
EOS (ABSOLUTE): 0 10*3/uL (ref 0.0–0.4)
Eos: 1 %
Hematocrit: 38 % (ref 34.0–46.6)
Hemoglobin: 13 g/dL (ref 11.1–15.9)
Immature Grans (Abs): 0 10*3/uL (ref 0.0–0.1)
Immature Granulocytes: 0 %
Lymphocytes Absolute: 1.2 10*3/uL (ref 0.7–3.1)
Lymphs: 43 %
MCH: 29 pg (ref 26.6–33.0)
MCHC: 34.2 g/dL (ref 31.5–35.7)
MCV: 85 fL (ref 79–97)
Monocytes Absolute: 0.4 10*3/uL (ref 0.1–0.9)
Monocytes: 13 %
Neutrophils Absolute: 1.2 10*3/uL — ABNORMAL LOW (ref 1.4–7.0)
Neutrophils: 43 %
Platelets: 228 10*3/uL (ref 150–450)
RBC: 4.48 x10E6/uL (ref 3.77–5.28)
RDW: 12.2 % (ref 11.7–15.4)
WBC: 2.8 10*3/uL — ABNORMAL LOW (ref 3.4–10.8)

## 2020-10-24 ENCOUNTER — Other Ambulatory Visit: Payer: Self-pay | Admitting: Family Medicine

## 2020-10-24 DIAGNOSIS — K921 Melena: Secondary | ICD-10-CM

## 2020-10-27 ENCOUNTER — Encounter (HOSPITAL_COMMUNITY): Payer: Self-pay | Admitting: Emergency Medicine

## 2020-10-27 ENCOUNTER — Other Ambulatory Visit: Payer: Self-pay

## 2020-10-27 ENCOUNTER — Emergency Department (HOSPITAL_COMMUNITY): Payer: BC Managed Care – PPO

## 2020-10-27 ENCOUNTER — Emergency Department (HOSPITAL_COMMUNITY)
Admission: EM | Admit: 2020-10-27 | Discharge: 2020-10-28 | Disposition: A | Discharge status: Home / Self Care | Payer: BC Managed Care – PPO | Attending: Emergency Medicine | Admitting: Emergency Medicine

## 2020-10-27 DIAGNOSIS — R109 Unspecified abdominal pain: Secondary | ICD-10-CM | POA: Diagnosis not present

## 2020-10-27 DIAGNOSIS — K921 Melena: Secondary | ICD-10-CM | POA: Diagnosis not present

## 2020-10-27 DIAGNOSIS — R197 Diarrhea, unspecified: Secondary | ICD-10-CM | POA: Diagnosis not present

## 2020-10-27 DIAGNOSIS — K429 Umbilical hernia without obstruction or gangrene: Secondary | ICD-10-CM | POA: Diagnosis not present

## 2020-10-27 DIAGNOSIS — K5792 Diverticulitis of intestine, part unspecified, without perforation or abscess without bleeding: Secondary | ICD-10-CM | POA: Diagnosis not present

## 2020-10-27 DIAGNOSIS — Z79899 Other long term (current) drug therapy: Secondary | ICD-10-CM | POA: Insufficient documentation

## 2020-10-27 DIAGNOSIS — R112 Nausea with vomiting, unspecified: Secondary | ICD-10-CM | POA: Insufficient documentation

## 2020-10-27 DIAGNOSIS — U071 COVID-19: Secondary | ICD-10-CM | POA: Insufficient documentation

## 2020-10-27 DIAGNOSIS — K529 Noninfective gastroenteritis and colitis, unspecified: Secondary | ICD-10-CM

## 2020-10-27 MED ORDER — LACTATED RINGERS IV BOLUS
1000.0000 mL | Freq: Once | INTRAVENOUS | Status: AC
  Administered 2020-10-28: 1000 mL via INTRAVENOUS

## 2020-10-27 MED ORDER — IOHEXOL 300 MG/ML  SOLN
100.0000 mL | Freq: Once | INTRAMUSCULAR | Status: AC | PRN
  Administered 2020-10-28: 100 mL via INTRAVENOUS

## 2020-10-27 NOTE — ED Provider Notes (Signed)
[facility] EMERGENCY DEPARTMENT Provider Note   CSN: [id number] Arrival date & time: 10/27/20  1831     History Chief Complaint  Patient presents with  . Abdominal Pain    Monica Young is a 40 y.o. female.  HPI Patient presents with abdominal pain.  Began tonight and reportedly was severe.  States it is crampy in the left lower abdomen.  Now more in the right lower abdomen.  States over the last month she has been dealing with blood in the stool and abdominal pain.  States she has been seen by PCP and they gave her nausea medicines which did not work.  States that she has lost 10 pounds.  States she is had nausea and vomiting and decreased intake.  States she has had blood in the stool.  At times it was with diarrhea and at times it was with normal stool.  No fevers or chills.  States that on Monday she tested positive for Covid.  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  She is unvaccinated.  States she has no symptoms for that.  States she gets tested twice a week and has had negative tests up until this point.  States pain was severe and crampy.  Would come and go earlier today.  States that she did have a bowel movement while in triage and it helped with the pain.  There was some blood in that stool also. Patient also recently stopped her Zoloft, thinking it may have been causing the vomiting. No vaginal bleeding or discharge.    Past Medical History:  Diagnosis Date  . IBS (irritable bowel syndrome)     Patient Active Problem List   Diagnosis Date Noted  . Motion sickness 10/22/2020  . Blood in stool 10/21/2020  . Dehydration syndrome 10/21/2020  . Mastodynia of right breast 11/01/2019  . Migraine without aura and without status migrainosus, not intractable 12/22/2018  . Generalized anxiety disorder 05/08/2016  . Mild single current episode of major depressive disorder (HCC) 05/08/2016  . IUD (intrauterine device) in place 05/08/2015  . Anemia 09/20/2013  . Diarrhea 09/20/2013  .  Irritable bowel syndrome 08/30/2013  . Anxiety and depression 08/30/2013    History reviewed. No pertinent surgical history.   OB History    Gravida  3   Para  2   Term  2   Preterm      AB  1   Living  2     SAB  1   IAB      Ectopic      Multiple      Live Births  2           Family History  Problem Relation Age of Onset  . Hypertension Father   . Heart disease Father   . Pancreatitis Brother   . Heart disease Maternal Grandmother   . Stroke Maternal Grandfather   . Cancer Paternal Grandmother        lung  . COPD Paternal Grandfather   . Heart disease Paternal Grandfather     Social History   Tobacco Use  . Smoking status: Never Smoker  . Smokeless tobacco: Never Used  Vaping Use  . Vaping Use: Never used  Substance Use Topics  . Alcohol use: Yes    Alcohol/week: 0.0 standard drinks    Comment: occassional  . Drug use: No    Home Medications Prior to Admission medications   Medication Sig Start Date End Date Taking? Authorizing Provider  levonorgestrel (MIRENA) 20 MCG/24HR IUD 1 each by Intrauterine route once.    [provider]  meclizine (ANTIVERT) 25 MG tablet Take one tablet by mouth twice per day as needed for nausea/motion sickness 10/22/20   Novella Olive, NP  ondansetron (ZOFRAN) 8 MG tablet Take 1 tablet (8 mg total) by mouth every 8 (eight) hours as needed for nausea or vomiting. 09/30/20   Babs Sciara, MD  rizatriptan (MAXALT) 10 MG tablet TAKE 1 TABLET BY MOUTH AS NEEDED FOR MIGRAINE. MAY REPEAT IN TWO HOURS IF NEEDED 05/26/20   Babs Sciara, MD  sertraline (ZOLOFT) 50 MG tablet Take one tab po qd Patient not taking: Reported on 10/21/2020 08/23/20   Campbell Riches, NP    Allergies    Patient has no known allergies.  Review of Systems   Review of Systems  Constitutional: Positive for appetite change.  HENT: Negative for congestion.   Respiratory: Negative for shortness of breath.   Gastrointestinal:  Positive for abdominal pain, blood in stool, diarrhea, nausea and vomiting.  Genitourinary: Negative for flank pain.  Musculoskeletal: Negative for back pain.  Skin: Negative for rash.  Neurological: Negative for weakness.  Psychiatric/Behavioral: Negative for confusion.    Physical Exam Updated Vital Signs BP 105/63   Pulse 72   Temp 98.7 F (37.1 C)   Resp 18   Ht 5\' 4"  (1.626 m)   Wt 61.2 kg   SpO2 100%   BMI 23.17 kg/m   Physical Exam Vitals and nursing note reviewed.  HENT:     Head: Atraumatic.  Cardiovascular:     Rate and Rhythm: Normal rate and regular rhythm.  Pulmonary:     Breath sounds: No wheezing or rhonchi.  Abdominal:     Hernia: No hernia is present.     Comments: Tenderness in lower abdomen.  Worse right lower quadrant with some suprapubic involvement to.  No hernia palpated.  Skin:    General: Skin is warm.     Capillary Refill: Capillary refill takes less than 2 seconds.  Neurological:     Mental Status: She is alert and oriented to person, place, and time.     ED Results / Procedures / Treatments   Labs (all labs ordered are listed, but only abnormal results are displayed) Labs Reviewed  RESP PANEL BY RT-PCR (FLU A&B, COVID) ARPGX2  COMPREHENSIVE METABOLIC PANEL  CBC WITH DIFFERENTIAL/PLATELET  LIPASE, BLOOD  PREGNANCY, URINE    EKG None  Radiology No results found.  Procedures Procedures (including critical care time)  Medications Ordered in ED Medications  lactated ringers bolus 1,000 mL (has no administration in time range)  iohexol (OMNIPAQUE) 300 MG/ML solution 100 mL (has no administration in time range)    ED Course  I have reviewed the triage vital signs and the nursing notes.  Pertinent labs & imaging results that were available during my care of the patient were reviewed by me and considered in my medical decision making (see chart for details).    MDM Rules/Calculators/A&P                          Patient  presents with abdominal pain.  Lower abdomen was severe earlier today.  However has had some dull abdominal pain and some nausea and diarrhea over the last month.  Previous CBC done by PCP has been reassuring.  However still has some tenderness now.  Has lost 10 pounds of weight.  Will get blood work and CT scan to further evaluate.  Care turned over to Dr.Delo.  Was reportedly Covid positive on Monday at work. Final Clinical Impression(s) / ED Diagnoses Final diagnoses:  None    Rx / DC Orders ED Discharge Orders    None       Davonna Belling, MD 10/27/20 2351

## 2020-10-27 NOTE — ED Triage Notes (Signed)
Pt c/o severe abdominal pain that began at 1700 today.  Pt reports blood in her stool. Pt reports N/V/D.  Pt states she tested covid positive on 10/21/20.  Pt states she had a BM in the waiting room and the pain is better.

## 2020-10-28 ENCOUNTER — Telehealth: Payer: Self-pay | Admitting: Internal Medicine

## 2020-10-28 ENCOUNTER — Encounter: Payer: Self-pay | Admitting: Family Medicine

## 2020-10-28 DIAGNOSIS — K429 Umbilical hernia without obstruction or gangrene: Secondary | ICD-10-CM | POA: Diagnosis not present

## 2020-10-28 DIAGNOSIS — K5792 Diverticulitis of intestine, part unspecified, without perforation or abscess without bleeding: Secondary | ICD-10-CM | POA: Diagnosis not present

## 2020-10-28 LAB — COMPREHENSIVE METABOLIC PANEL
ALT: 17 U/L (ref 0–44)
AST: 20 U/L (ref 15–41)
Albumin: 5.3 g/dL — ABNORMAL HIGH (ref 3.5–5.0)
Alkaline Phosphatase: 47 U/L (ref 38–126)
Anion gap: 10 (ref 5–15)
BUN: 18 mg/dL (ref 6–20)
CO2: 25 mmol/L (ref 22–32)
Calcium: 9.3 mg/dL (ref 8.9–10.3)
Chloride: 103 mmol/L (ref 98–111)
Creatinine, Ser: 0.52 mg/dL (ref 0.44–1.00)
GFR, Estimated: >60 mL/min (ref >60)
Glucose, Bld: 105 mg/dL — ABNORMAL HIGH (ref 70–99)
Potassium: 3.5 mmol/L (ref 3.5–5.1)
Sodium: 138 mmol/L (ref 135–145)
Total Bilirubin: 0.8 mg/dL (ref 0.3–1.2)
Total Protein: 8.4 g/dL — ABNORMAL HIGH (ref 6.5–8.1)

## 2020-10-28 LAB — CBC WITH DIFFERENTIAL/PLATELET
Abs Immature Granulocytes: 0.01 10*3/uL (ref 0.00–0.07)
Basophils Absolute: 0 10*3/uL (ref 0.0–0.1)
Basophils Relative: 0 %
Eosinophils Absolute: 0 10*3/uL (ref 0.0–0.5)
Eosinophils Relative: 0 %
HCT: 42.5 % (ref 36.0–46.0)
Hemoglobin: 14.3 g/dL (ref 12.0–15.0)
Immature Granulocytes: 0 %
Lymphocytes Relative: 21 %
Lymphs Abs: 1.4 10*3/uL (ref 0.7–4.0)
MCH: 28.9 pg (ref 26.0–34.0)
MCHC: 33.6 g/dL (ref 30.0–36.0)
MCV: 86 fL (ref 80.0–100.0)
Monocytes Absolute: 0.2 10*3/uL (ref 0.1–1.0)
Monocytes Relative: 4 %
Neutro Abs: 4.8 10*3/uL (ref 1.7–7.7)
Neutrophils Relative %: 75 %
Platelets: 253 10*3/uL (ref 150–400)
RBC: 4.94 MIL/uL (ref 3.87–5.11)
RDW: 12.2 % (ref 11.5–15.5)
WBC: 6.4 10*3/uL (ref 4.0–10.5)
nRBC: 0 % (ref 0.0–0.2)

## 2020-10-28 LAB — RESP PANEL BY RT-PCR (FLU A&B, COVID) ARPGX2
Influenza A by PCR: NEGATIVE
Influenza B by PCR: NEGATIVE
SARS Coronavirus 2 by RT PCR: POSITIVE — AB

## 2020-10-28 LAB — LIPASE, BLOOD: Lipase: 44 U/L (ref 11–51)

## 2020-10-28 LAB — HCG, QUANTITATIVE, PREGNANCY: hCG, Beta Chain, Quant, S: <1 m[IU]/mL (ref <5)

## 2020-10-28 MED ORDER — PROMETHAZINE HCL 25 MG PO TABS: ORAL_TABLET | ORAL | 2 refills | Status: DC

## 2020-10-28 MED ORDER — METRONIDAZOLE 500 MG PO TABS
500.0000 mg | ORAL_TABLET | Freq: Once | ORAL | Status: AC
  Administered 2020-10-28: 500 mg via ORAL
  Filled 2020-10-28: qty 1

## 2020-10-28 MED ORDER — CIPROFLOXACIN HCL 250 MG PO TABS
500.0000 mg | ORAL_TABLET | Freq: Once | ORAL | Status: AC
  Administered 2020-10-28: 500 mg via ORAL
  Filled 2020-10-28: qty 2

## 2020-10-28 MED ORDER — METRONIDAZOLE 500 MG PO TABS: 500.0000 mg | ORAL_TABLET | Freq: Three times a day (TID) | ORAL | 0 refills | Status: DC

## 2020-10-28 MED ORDER — CIPROFLOXACIN HCL 500 MG PO TABS: 500.0000 mg | ORAL_TABLET | Freq: Two times a day (BID) | ORAL | 0 refills | Status: DC

## 2020-10-28 MED ORDER — DICYCLOMINE HCL 10 MG PO CAPS: ORAL_CAPSULE | ORAL | 2 refills | Status: DC

## 2020-10-28 NOTE — Telephone Encounter (Signed)
ER REFERRAL  

## 2020-10-28 NOTE — Discharge Instructions (Addendum)
Begin taking Cipro and Flagyl as prescribed.  Follow-up with gastroenterology in the next week.  The contact information for the GI office here in [place] has been provided in this discharge summary for you to call and make these arrangements.  Return to the emergency department if you develop severe abdominal pain, worsening bleeding, or other new and concerning symptoms.

## 2020-10-28 NOTE — ED Notes (Signed)
PT to CT.

## 2020-10-28 NOTE — ED Notes (Signed)
Date and time results received: 10/28/20 1:31 AM  Test: COVID Critical Value: POSITIVE  Name of Provider Notified: Dr. Judd Lien  Orders Received? Or Actions Taken?:

## 2020-10-28 NOTE — Telephone Encounter (Signed)
Pt also called back and wanted to ask dr scott does the nausea go along with colitis. Pt also thinks she needs phenergan supp because she cannot keep down the cipro and flagyl.  [facility].

## 2020-10-28 NOTE — ED Provider Notes (Signed)
Care assumed from Dr. Rubin Payor at shift change.  Patient awaiting results of a CT scan.  She has been having lower abdominal discomfort and bloody diarrhea for the past 2 days.  She denies fevers or chills.  She denies ill contacts.  CT scan has resulted and shows a segment of proctocolitis within the lower abdomen.  I suspect this is the cause of her symptoms.  This is likely infectious in nature.  Patient will be treated with Cipro and Flagyl and is to follow-up with gastroenterology.  She has already had a referral placed by her primary doctor.   Geoffery Lyons, MD 10/28/20 [id number]

## 2020-10-28 NOTE — Telephone Encounter (Signed)
Nurses Several things Gastroenterology consultation necessary to rule out inflammatory bowel disease such as ulcerative colitis, Crohn's  I recommend dicyclomine 10 mg capsule 1 taken up to 3 times daily as needed to help with abdominal discomfort, #45 with 2 refills  Also may use Phenergan 25 mg 1 twice daily as needed nausea caution drowsiness, #20 with 2 refills  Also clarify with patient gastroenterology consultation has this been set up, make sure that the referral is in motion please  If the patient should have further progression worsening issues or find that she cannot tolerate the medicine I can see her in follow-up this week

## 2020-10-28 NOTE — Addendum Note (Signed)
Addended by: Marlowe Shores on: 10/28/2020 02:29 PM   Modules accepted: Orders

## 2020-10-29 ENCOUNTER — Other Ambulatory Visit: Payer: Self-pay | Admitting: *Deleted

## 2020-10-29 MED ORDER — PROMETHAZINE HCL 25 MG RE SUPP: 25.0000 mg | Freq: Three times a day (TID) | RECTAL | 2 refills | Status: DC | PRN

## 2020-10-29 NOTE — Telephone Encounter (Signed)
It is fine to do the Phenergan suppository 25 mg per rectum every 8 hours as needed caution drowsiness, #12, 2 refills  If persistent vomiting from the antibiotics we would need to see her to potentially reassess or try different antibiotic or potentially patient would have to go back to the ER  It is very difficult to do any type of meaningful assessment via MyChart

## 2020-10-29 NOTE — Telephone Encounter (Signed)
Called and discussed with pt. Pt verbalized understanding. Med sent to pharm 

## 2020-10-29 NOTE — Telephone Encounter (Signed)
Pt requested  phenergan supp but tablets were sent. She does not know if that's because she has colitis and should not use supp. But see message below pt sent in. States she cannot keep down anything and only kept down one dose of antibiotics yesterday. Worried not getting her antibiotics she will get worse. Wonders if she should be admitted to hospital to get antibiotics or if she can try the phenergan supp

## 2020-10-29 NOTE — Telephone Encounter (Signed)
Ok to schedule new pt ov.  

## 2020-10-31 ENCOUNTER — Encounter: Payer: Self-pay | Admitting: Gastroenterology

## 2020-10-31 ENCOUNTER — Other Ambulatory Visit: Payer: Self-pay

## 2020-10-31 ENCOUNTER — Ambulatory Visit (INDEPENDENT_AMBULATORY_CARE_PROVIDER_SITE_OTHER): Payer: BC Managed Care – PPO | Admitting: Gastroenterology

## 2020-10-31 ENCOUNTER — Telehealth: Payer: Self-pay

## 2020-10-31 VITALS — BP 121/67 | HR 90 | Temp 97.3°F | Ht 64.0 in | Wt 139.2 lb

## 2020-10-31 DIAGNOSIS — K219 Gastro-esophageal reflux disease without esophagitis: Secondary | ICD-10-CM | POA: Diagnosis not present

## 2020-10-31 DIAGNOSIS — R197 Diarrhea, unspecified: Secondary | ICD-10-CM

## 2020-10-31 MED ORDER — PANTOPRAZOLE SODIUM 40 MG PO TBEC: 40.0000 mg | DELAYED_RELEASE_TABLET | Freq: Every day | ORAL | 3 refills | Status: DC

## 2020-10-31 NOTE — Telephone Encounter (Signed)
Monica Young, please advise ASA for TCS/EGD w/Dr. Marletta Lor.

## 2020-10-31 NOTE — Patient Instructions (Addendum)
I have sent in Protonix to take once each morning, 30 minutes before breakfast.   Please complete the stool samples and return to [facility]. We will call as soon as they result! For now, just stick with taking the Cipro and Flagyl.  We are arranging a colonoscopy and upper endoscopy in the near future.   Further recommendations to follow!   It was a pleasure to see you today. I want to create trusting relationships with patients to provide genuine, compassionate, and quality care. I value your feedback. If you receive a survey regarding your visit,  I greatly appreciate you taking time to fill this out.   Gelene Mink, PhD, ANP-BC [facility]

## 2020-10-31 NOTE — Progress Notes (Signed)
Primary Care Physician:  Babs Sciara, MD  Referring Physician: Dorena Bodo, NP, [facility] Primary Gastroenterologist:  Dr. Marletta Lor  Chief Complaint  Patient presents with  . Nausea    W/ vomiting. Rx'd zofran/phenergan. Not helping  . Abdominal Cramping    Went to ER [facility] Sunday. Rx'd antibiotic. Having hard time keeping down    HPI:   Monica Young is a 40 y.o. female presenting today at the request of Dorena Bodo, NP, due to rectal bleeding. She is a Armed forces technical officer. During routine Covid screenings for work, she tested positive (12/27). She has documented positive Covid test on 1/3 but without respiratory symptoms. She will be going back to work today, 1/6, as she has been out the recommended amount of time. She tells me that today she tested negative through work.   She notes a history of  IBS with more pronounced looser stools historically.  No chronic meds for this. Notes first of December started having N/V for a few weeks. Woke up with dry scaly patches under her eyes. Over the weekend, she started having diarrhea. Noted Sunday night felt abdominal pain severe like labor pain. Associated bloody diarrhea. Placed on Cipro and Flagyl per ED. 2 loose stools per day. Gets nauseated easily. Took phenergan last night. Some streaks of blood in stool as well. No NSAIDs. Takes tylenol pm at night. No nausea today. 10 years ago had similar pain and diarrhea but doesn't remember if was bloody.    CT from ED with long segment inflammatory change involving descending and rectosigmoid colon as well as rectum compatible with infectious or inflammatory proctocolitis. CT 10/27/20. Clinically, she is feeling better overall.   Has chronic GERD. Has taken an OTC PPI but can't remember the name. Notices more burping lately. Tries to stay away from NSAIDs. Clearing throat after eating. Coughing after eating. No solid food dysphagia. 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 Wants EGD at time of colonoscopy.     Past Medical History:  Diagnosis Date  . GERD (gastroesophageal reflux disease)   . IBS (irritable bowel syndrome)     Past Surgical History:  Procedure Laterality Date  . None      Current Outpatient Medications  Medication Sig Dispense Refill  . ciprofloxacin (CIPRO) 500 MG tablet Take 1 tablet (500 mg total) by mouth 2 (two) times daily. One po bid x 7 days 14 tablet 0  . levonorgestrel (MIRENA) 20 MCG/24HR IUD 1 each by Intrauterine route once.    . metroNIDAZOLE (FLAGYL) 500 MG tablet Take 1 tablet (500 mg total) by mouth 3 (three) times daily. One po tid x 7 days 21 tablet 0  . ondansetron (ZOFRAN) 8 MG tablet Take 1 tablet (8 mg total) by mouth every 8 (eight) hours as needed for nausea or vomiting. 15 tablet 2  . pantoprazole (PROTONIX) 40 MG tablet Take 1 tablet (40 mg total) by mouth daily. 30 minutes before breakfast 30 tablet 3  . promethazine (PHENERGAN) 25 MG tablet Take one tablet po BID prn nausea. CAUTION:DROWSINESS 20 tablet 2   No current facility-administered medications for this visit.    Allergies as of 10/31/2020  . (No Known Allergies)    Family History  Problem Relation Age of Onset  . Hypertension Father   . Heart disease Father   . Pancreatitis Brother   . Heart disease Maternal Grandmother   .  Stroke Maternal Grandfather   . Cancer Paternal Grandmother        lung  . COPD Paternal Grandfather   . Heart disease Paternal Grandfather   . Crohn's disease Neg Hx   . Ulcerative colitis Neg Hx     Social History   Socioeconomic History  . Marital status: Married    Spouse name: Not on file  . Number of children: Not on file  . Years of education: Not on file  . Highest education level: Not on file  Occupational History  . Occupation: [employer]    Comment: hospice nurse  Tobacco Use  . Smoking status: Never Smoker  . Smokeless tobacco:  Never Used  Vaping Use  . Vaping Use: Never used  Substance and Sexual Activity  . Alcohol use: Yes    Alcohol/week: 0.0 standard drinks    Comment: occassional  . Drug use: No  . Sexual activity: Yes    Birth control/protection: I.U.D.  Other Topics Concern  . Not on file  Social History Narrative  . Not on file   Social Determinants of Health   Financial Resource Strain: Not on file  Food Insecurity: Not on file  Transportation Needs: Not on file  Physical Activity: Not on file  Stress: Not on file  Social Connections: Not on file  Intimate Partner Violence: Not on file    Review of Systems: Gen: Denies any fever, chills, fatigue, weight loss, lack of appetite.  CV: Denies chest pain, heart palpitations, peripheral edema, syncope.  Resp: Denies shortness of breath at rest or with exertion. Denies wheezing or cough.  GI: see HPI GU : Denies urinary burning, urinary frequency, urinary hesitancy MS: Denies joint pain, muscle weakness, cramps, or limitation of movement.  Derm: Denies rash, itching, dry skin Psych: Denies depression, anxiety, memory loss, and confusion Heme: see HPI  Physical Exam: BP 121/67   Pulse 90   Temp (!) 97.3 F (36.3 C)   Ht 5\' 4"  (1.626 m)   Wt 139 lb 3.2 oz (63.1 kg)   BMI 23.89 kg/m  General:   Alert and oriented. Pleasant and cooperative. Well-nourished and well-developed.  Head:  Normocephalic and atraumatic. Eyes:  Without icterus, sclera clear and conjunctiva pink.  Ears:  Normal auditory acuity. Mouth:  Mask in place Lungs:  Clear to auscultation bilaterally. No wheezes, rales, or rhonchi. No distress.  Heart:  S1, S2 present without murmurs appreciated.  Abdomen:  +BS, soft, non-tender and non-distended. No HSM noted. No guarding or rebound. No masses appreciated.  Rectal:  Deferred  Msk:  Symmetrical without gross deformities. Normal posture. Extremities:  Without edema. Neurologic:  Alert and  oriented x4;  grossly normal  neurologically. Skin:  Intact without significant lesions or rashes. Psych:  Alert and cooperative. Normal mood and affect.  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 Clinically, she is improving with symptoms overall. Interestingly, she tested positive for Covid through work on 12/27. Unable to exclude these constellation of symptoms as part of COVID presentation.   For now, will check stool studies. Needs colonoscopy for diagnostic purposes once acute illness resolves. She does note GERD symptoms with throat clearing and chronic postprandial cough but denying solid food dysphagia.  She is currently not on a PPI. Will start on Protonix once daily. She is also requesting an EGD at time of colonoscopy.    PLAN: Cdiff and GI pathogen panel  Start Protonix once daily  Complete empiric course of Cipro and Flagyl she has already started  Proceed with colonoscopy/EGD by Dr. Marletta Lor  in near future: the risks, benefits, and alternatives have been discussed with the patient in detail. The patient states understanding and desires to proceed.   Further recommendations to follow   Gelene Mink, PhD, ANP-BC [facility]

## 2020-11-01 ENCOUNTER — Other Ambulatory Visit: Payer: Self-pay

## 2020-11-01 DIAGNOSIS — R197 Diarrhea, unspecified: Secondary | ICD-10-CM

## 2020-11-01 DIAGNOSIS — K219 Gastro-esophageal reflux disease without esophagitis: Secondary | ICD-10-CM

## 2020-11-01 NOTE — Telephone Encounter (Signed)
I'm sorry! ASA 1

## 2020-11-01 NOTE — Telephone Encounter (Signed)
PA for TCS/EGD submitted via [employer] website.  Order ID for TCS: [id number], valid 11/01/20-12/30/20. Order ID for EGD: [id number], valid 11/01/20-12/30/20.

## 2020-11-01 NOTE — Telephone Encounter (Signed)
Noted.  Called pt, TCS/EGD w/Propofol w/Dr. Marletta Lor scheduled for 11/29/20 at 12:15pm. No COVID test needed prior to procedure d/t she was COVID positive 10/28/20 (endo scheduler informed). Pregnancy test ordered to be done prior to procedure-pt is aware. Orders entered. Pregnancy test order and procedure instructions mailed.

## 2020-11-05 NOTE — Progress Notes (Signed)
CC'ED TO PCP 

## 2020-11-15 ENCOUNTER — Encounter: Payer: Self-pay | Admitting: Nurse Practitioner

## 2020-11-15 ENCOUNTER — Other Ambulatory Visit: Payer: Self-pay

## 2020-11-15 ENCOUNTER — Ambulatory Visit (INDEPENDENT_AMBULATORY_CARE_PROVIDER_SITE_OTHER): Payer: BC Managed Care – PPO | Admitting: Nurse Practitioner

## 2020-11-15 VITALS — BP 124/82 | HR 97 | Temp 97.2°F | Wt 141.0 lb

## 2020-11-15 DIAGNOSIS — K219 Gastro-esophageal reflux disease without esophagitis: Secondary | ICD-10-CM

## 2020-11-15 DIAGNOSIS — R112 Nausea with vomiting, unspecified: Secondary | ICD-10-CM

## 2020-11-15 DIAGNOSIS — F32A Depression, unspecified: Secondary | ICD-10-CM

## 2020-11-15 DIAGNOSIS — F419 Anxiety disorder, unspecified: Secondary | ICD-10-CM

## 2020-11-15 DIAGNOSIS — K529 Noninfective gastroenteritis and colitis, unspecified: Secondary | ICD-10-CM

## 2020-11-15 NOTE — Progress Notes (Signed)
   Subjective:    Patient ID: Monica Young, female    DOB: May 30, 1981, 40 y.o.   MRN: [id number]  HPI Presents for recheck on her nausea and vomiting.  Patient actually had planned to cancel appointment but was too late to do that.  Symptoms began around 1 December.  She thought at that time it could potentially be due to her Zoloft which she stopped.  Symptoms continued despite this.  Took Zofran for about a week with minimal relief.  Continued to have bouts of nausea and vomiting.  Was tested positive for COVID on 10/28/2020.  Patient was losing a lot of weight, reports 15 pound weight loss.  Also reported blood in her stool with diarrhea that began around 12/28.  Went to the emergency room on 10/27/2020 for full work-up was diagnosed with colitis.  Was started on 2 antibiotics, is doing much better.  Now having minimal nausea and vomiting which he describes as "small bouts versus all the time.  Hydrating herself well.  Eating pretty much a regular diet at this point.  Has a follow-up with GI for more studies on 11/29/2020.  Minimal abdominal pain.  Is currently off of her Protonix. Depression screen [facility] 2/9 11/15/2020 06/21/2020 12/22/2018 12/20/2017 05/08/2016  Decreased Interest 2 1 0 0 2  Down, Depressed, Hopeless 1 1 0 0 2  PHQ - 2 Score 3 2 0 0 4  Altered sleeping 3 3 1  - 0  Tired, decreased energy 3 3 1  - 2  Change in appetite 0 0 0 - 2  Feeling bad or failure about yourself  3 1 0 - 0  Trouble concentrating 1 0 0 - 0  Moving slowly or fidgety/restless 1 0 0 - 1  Suicidal thoughts 0 0 0 - 0  PHQ-9 Score 14 9 2  - 9  Difficult doing work/chores Somewhat difficult Somewhat difficult Not difficult at all - Somewhat difficult     Review of Systems     Objective:   Physical Exam NAD.  Alert, oriented.  Calm cheerful affect.  Lungs clear.  Heart regular rate rhythm.  Abdomen soft nondistended with active bowel sounds x4.  Moderate epigastric area tenderness.  Moderate tenderness to deep  palpation of the left lower quadrant which is the area of colitis according to her CT scan.  No rebound or guarding.  No obvious masses.  Patient states the pain is much improved from previously. Today's Vitals   11/15/20 1104  BP: 124/82  Pulse: 97  Temp: (!) 97.2 F (36.2 C)  SpO2: 99%  Weight: 141 lb (64 kg)   Body mass index is 24.2 kg/m.       Assessment & Plan:   Problem List Items Addressed This Visit      Digestive   Gastroesophageal reflux disease     Other   Anxiety and depression    Other Visit Diagnoses    Colitis    -  Primary   Non-intractable vomiting with nausea, unspecified vomiting type         Restart Protonix as directed at least until she sees her GI specialist for further instructions. Patient has medications such as Phenergan at home if she has any further nausea or vomiting. Hold on Zoloft for now unless patient wishes to restart in the future. Warning signs reviewed.  Follow-up with GI specialist as planned, call back sooner if needed.

## 2020-11-25 ENCOUNTER — Telehealth: Payer: Self-pay | Admitting: *Deleted

## 2020-11-25 NOTE — Telephone Encounter (Signed)
Called pt and LMOVM to call back. We need to r/s procedure scheduled for 2/4 with Dr. Marletta Lor

## 2020-11-26 NOTE — Telephone Encounter (Signed)
Patient called. She has been rescheduled to 3/11 (am appt). Aware will mail new prep instructions. She was covid + 10/28/20. Advised endo will call 3 days prior to procedure. She voiced understanding.

## 2020-11-27 NOTE — Telephone Encounter (Signed)
Previous PA's for TCS/EGD expired 12/30/20.  Called [employer] and spoke to [place] (call ref# [id number]. She transferred call to [employer], spoke to [place], unable to update date of service. She cancelled cases. Cases to be resubmitted.

## 2020-12-04 ENCOUNTER — Other Ambulatory Visit: Payer: Self-pay

## 2020-12-04 NOTE — Telephone Encounter (Signed)
PA for TCS/EGD scheduled for 01/03/21 submitted via AIM website. Order ID for TCS: [id number], valid 12/04/20-02/01/21. Order ID for EGD: [id number], valid 12/04/20-02/01/21.

## 2020-12-30 ENCOUNTER — Encounter (HOSPITAL_COMMUNITY): Payer: Self-pay

## 2021-01-03 ENCOUNTER — Ambulatory Visit (HOSPITAL_COMMUNITY)
Admission: RE | Admit: 2021-01-03 | Discharge: 2021-01-03 | Disposition: A | Discharge status: Home / Self Care | Payer: BC Managed Care – PPO | Attending: Internal Medicine | Admitting: Internal Medicine

## 2021-01-03 ENCOUNTER — Encounter (HOSPITAL_COMMUNITY)
Admission: RE | Disposition: A | Discharge status: Home / Self Care | Payer: Self-pay | Source: Home / Self Care | Attending: Internal Medicine

## 2021-01-03 ENCOUNTER — Encounter (HOSPITAL_COMMUNITY): Payer: Self-pay

## 2021-01-03 ENCOUNTER — Other Ambulatory Visit: Payer: Self-pay

## 2021-01-03 ENCOUNTER — Ambulatory Visit (HOSPITAL_COMMUNITY): Payer: BC Managed Care – PPO | Admitting: Anesthesiology

## 2021-01-03 DIAGNOSIS — R1310 Dysphagia, unspecified: Secondary | ICD-10-CM

## 2021-01-03 DIAGNOSIS — K529 Noninfective gastroenteritis and colitis, unspecified: Secondary | ICD-10-CM | POA: Diagnosis not present

## 2021-01-03 DIAGNOSIS — K589 Irritable bowel syndrome without diarrhea: Secondary | ICD-10-CM | POA: Insufficient documentation

## 2021-01-03 DIAGNOSIS — K2970 Gastritis, unspecified, without bleeding: Secondary | ICD-10-CM | POA: Diagnosis not present

## 2021-01-03 DIAGNOSIS — R197 Diarrhea, unspecified: Secondary | ICD-10-CM

## 2021-01-03 DIAGNOSIS — K219 Gastro-esophageal reflux disease without esophagitis: Secondary | ICD-10-CM | POA: Insufficient documentation

## 2021-01-03 DIAGNOSIS — K648 Other hemorrhoids: Secondary | ICD-10-CM | POA: Insufficient documentation

## 2021-01-03 DIAGNOSIS — K921 Melena: Secondary | ICD-10-CM | POA: Diagnosis not present

## 2021-01-03 DIAGNOSIS — K2289 Other specified disease of esophagus: Secondary | ICD-10-CM | POA: Diagnosis not present

## 2021-01-03 DIAGNOSIS — Z793 Long term (current) use of hormonal contraceptives: Secondary | ICD-10-CM | POA: Insufficient documentation

## 2021-01-03 DIAGNOSIS — K222 Esophageal obstruction: Secondary | ICD-10-CM | POA: Diagnosis not present

## 2021-01-03 DIAGNOSIS — F418 Other specified anxiety disorders: Secondary | ICD-10-CM | POA: Diagnosis not present

## 2021-01-03 HISTORY — PX: COLONOSCOPY WITH PROPOFOL: SHX5780

## 2021-01-03 HISTORY — PX: BIOPSY: SHX5522

## 2021-01-03 HISTORY — PX: ESOPHAGOGASTRODUODENOSCOPY (EGD) WITH PROPOFOL: SHX5813

## 2021-01-03 HISTORY — PX: BALLOON DILATION: SHX5330

## 2021-01-03 LAB — PREGNANCY, URINE: Preg Test, Ur: NEGATIVE

## 2021-01-03 SURGERY — COLONOSCOPY WITH PROPOFOL
Anesthesia: General

## 2021-01-03 MED ORDER — LACTATED RINGERS IV SOLN
INTRAVENOUS | Status: DC
  Administered 2021-01-03: 1000 mL via INTRAVENOUS

## 2021-01-03 MED ORDER — OMEPRAZOLE 20 MG PO CPDR: 20.0000 mg | DELAYED_RELEASE_CAPSULE | Freq: Two times a day (BID) | ORAL | 5 refills | Status: DC

## 2021-01-03 MED ORDER — PROPOFOL 10 MG/ML IV BOLUS
INTRAVENOUS | Status: DC | PRN
  Administered 2021-01-03: 50 mg via INTRAVENOUS
  Administered 2021-01-03: 20 mg via INTRAVENOUS
  Administered 2021-01-03: 50 mg via INTRAVENOUS

## 2021-01-03 MED ORDER — PROPOFOL 500 MG/50ML IV EMUL
INTRAVENOUS | Status: DC | PRN
  Administered 2021-01-03: 150 ug/kg/min via INTRAVENOUS

## 2021-01-03 MED ORDER — LIDOCAINE HCL 1 % IJ SOLN
INTRAMUSCULAR | Status: DC | PRN
  Administered 2021-01-03: 50 mg via INTRADERMAL

## 2021-01-03 NOTE — Op Note (Signed)
[facility] Patient Name: Monica Young Procedure Date: 01/03/2021 8:56 AM MRN: [id number] Date of Birth: 1980/11/02 Attending MD: Elon Alas. Abbey Chatters DO CSN: [id number] Age: 40 Admit Type: Outpatient Procedure:                Colonoscopy Indications:              Hematochezia, Abnormal CT of the GI tract, Diarrhea Providers:                Elon Alas. Abbey Chatters, DO, Gwenlyn Fudge, RN, Aram Candela Referring MD:              Medicines:                See the Anesthesia note for documentation of the                            administered medications Complications:            No immediate complications. Estimated Blood Loss:     Estimated blood loss was minimal. Procedure:                Pre-Anesthesia Assessment:                           - The anesthesia plan was to use monitored                            anesthesia care (MAC).                           After obtaining informed consent, the colonoscope                            was passed under direct vision. Throughout the                            procedure, the patient's blood pressure, pulse, and                            oxygen saturations were monitored continuously. The                            [id number]-[id number] ([id number]) scope was introduced through                            the anus and advanced to the the terminal ileum,                            with identification of the appendiceal orifice and                            IC valve. The colonoscopy was performed without                            difficulty. The patient  tolerated the procedure                            well. The quality of the bowel preparation was                            evaluated using the BBPS [facility] Bowel Preparation                            Scale) with scores of: Right Colon = 3, Transverse                            Colon = 3 and Left Colon = 3 (entire mucosa seen                            well with no residual  staining, small fragments of                            stool or opaque liquid). The total BBPS score                            equals 9. Scope In: 8:59:20 AM Scope Out: 9:12:11 AM Scope Withdrawal Time: 0 hours 8 minutes 36 seconds  Total Procedure Duration: 0 hours 12 minutes 51 seconds  Findings:      The perianal and digital rectal examinations were normal.      Non-bleeding internal hemorrhoids were found during endoscopy.      The terminal ileum appeared normal. Biopsies were taken with a cold       forceps for histology.      Biopsies were taken with a cold forceps in the entire colon for       histology.      No active inflammation identified throughout entire colon and TI. Impression:               - Non-bleeding internal hemorrhoids.                           - The examined portion of the ileum was normal.                            Biopsied.                           - Biopsies were taken with a cold forceps for                            histology in the entire colon. Moderate Sedation:      Per Anesthesia Care Recommendation:           - Patient has a contact number available for                            emergencies. The signs and symptoms of potential  delayed complications were discussed with the                            patient. Return to normal activities tomorrow.                            Written discharge instructions were provided to the                            patient.                           - Resume previous diet.                           - Continue present medications.                           - Await pathology results.                           - Repeat colonoscopy in 10 years for screening                            purposes.                           - Return to GI clinic in 3 months.                           - No active inflammation identified throughout                            entire colon and TI. Previously  noted colitis                            likely was infectious and has resolved. Procedure Code(s):        --- Professional ---                           [phone], Colonoscopy, flexible; with biopsy, single                            or multiple Diagnosis Code(s):        --- Professional ---                           K64.8, Other hemorrhoids                           K92.1, Melena (includes Hematochezia)                           R19.7, Diarrhea, unspecified                           R93.3, Abnormal findings on diagnostic imaging of  other parts of digestive tract CPT copyright 2019 American Medical Association. All rights reserved. The codes documented in this report are preliminary and upon coder review may  be revised to meet current compliance requirements. Elon Alas. Abbey Chatters, DO [name], DO 01/03/2021 9:15:15 AM This report has been signed electronically. Number of Addenda: 0

## 2021-01-03 NOTE — Transfer of Care (Signed)
Immediate Anesthesia Transfer of Care Note  Patient: Monica Young  Procedure(s) Performed: COLONOSCOPY WITH PROPOFOL (N/A ) ESOPHAGOGASTRODUODENOSCOPY (EGD) WITH PROPOFOL (N/A ) BALLOON DILATION BIOPSY  Patient Location: Endoscopy Unit  Anesthesia Type:General  Level of Consciousness: awake  Airway & Oxygen Therapy: Patient Spontanous Breathing  Post-op Assessment: Report given to RN  Post vital signs: Reviewed  Last Vitals:  Vitals Value Taken Time  BP    Temp 36.8 C 01/03/21 0918  Pulse    Resp    SpO2      Last Pain:  Vitals:   01/03/21 0918  TempSrc: Oral  PainSc: 0-No pain      Patients Stated Pain Goal: 6 (01/03/21 0730)  Complications: No complications documented.

## 2021-01-03 NOTE — H&P (Signed)
Primary Care Physician:  Babs Sciara, MD Primary Gastroenterologist:  Dr. Marletta Lor  Pre-Procedure History & Physical: HPI:  Monica Young is a 40 y.o. female is here for an EGD for GERD and colonoscopy to be performed for colitis, diarrhea, rectal bleeding.  She notes a history of  IBS with more pronounced looser stools historically.  No chronic meds for this. Notes first of December started having N/V for a few weeks. Woke up with dry scaly patches under her eyes. Over the weekend, she started having diarrhea. Noted Sunday night felt abdominal pain severe like labor pain. Associated bloody diarrhea. Placed on Cipro and Flagyl per ED. 2 loose stools per day. Gets nauseated easily. Took phenergan last night. Some streaks of blood in stool as well. No NSAIDs. Takes tylenol pm at night. No nausea today. 10 years ago had similar pain and diarrhea but doesn't remember if was bloody.    CT from ED with long segment inflammatory change involving descending and rectosigmoid colon as well as rectum compatible with infectious or inflammatory proctocolitis. CT 10/27/20. Clinically, she is feeling better overall.   Has chronic GERD. Has taken an OTC PPI but can't remember the name. Notices more burping lately. Tries to stay away from NSAIDs. Clearing throat after eating. Coughing after eating. No solid food dysphagia. 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   Past Medical History:  Diagnosis Date  . GERD (gastroesophageal reflux disease)   . IBS (irritable bowel syndrome)     Past Surgical History:  Procedure Laterality Date  . None    . WISDOM TOOTH EXTRACTION      Prior to Admission medications   Medication Sig Start Date End Date Taking? Authorizing Provider  acetaminophen (TYLENOL) 500 MG tablet Take 1,000 mg by mouth every 8 (eight) hours as needed for moderate pain.   Yes [provider]  levonorgestrel (MIRENA) 20 MCG/24HR IUD 1  each by Intrauterine route once.   Yes [provider]  diphenhydramine-acetaminophen (TYLENOL PM) 25-500 MG TABS tablet Take 1 tablet by mouth at bedtime as needed (sleep).    [provider]  pantoprazole (PROTONIX) 40 MG tablet Take 1 tablet (40 mg total) by mouth daily. 30 minutes before breakfast Patient not taking: No sig reported 10/31/20   Gelene Mink, NP  promethazine (PHENERGAN) 25 MG tablet Take one tablet po BID prn nausea. CAUTION:DROWSINESS Patient not taking: No sig reported 10/28/20   Babs Sciara, MD    Allergies as of 11/01/2020  . (No Known Allergies)    Family History  Problem Relation Age of Onset  . Hypertension Father   . Heart disease Father   . Pancreatitis Brother   . Heart disease Maternal Grandmother   . Stroke Maternal Grandfather   . Cancer Paternal Grandmother        lung  . COPD Paternal Grandfather   . Heart disease Paternal Grandfather   . Crohn's disease Neg Hx   . Ulcerative colitis Neg Hx     Social History   Socioeconomic History  . Marital status: Married    Spouse name: Not on file  . Number of children: Not on file  . Years of education: Not on file  . Highest education level: Not on file  Occupational History  . 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 Smoking status: Never Smoker  . Smokeless tobacco: Never  Used  Vaping Use  . Vaping Use: Never used  Substance and Sexual Activity  . Alcohol use: Yes    Alcohol/week: 0.0 standard drinks    Comment: occassional  . Drug use: No  . Sexual activity: Yes    Birth control/protection: I.U.D.  Other Topics Concern  . Not on file  Social History Narrative  . Not on file   Social Determinants of Health   Financial Resource Strain: Not on file  Food Insecurity: Not on file  Transportation Needs: Not on file  Physical Activity: Not on file  Stress: Not on file  Social Connections: Not on file  Intimate Partner  Violence: Not on file    Review of Systems: See HPI, otherwise negative ROS  Physical Exam: Vital signs in last 24 hours: Temp:  [98.2 F (36.8 C)] 98.2 F (36.8 C) (03/11 0730) Pulse Rate:  [80] 80 (03/11 0730) Resp:  [17] 17 (03/11 0730) BP: (110)/(56) 110/56 (03/11 0730) SpO2:  [100 %] 100 % (03/11 0730) Weight:  [63 kg] 63 kg (03/11 0730)   General:   Alert,  Well-developed, well-nourished, pleasant and cooperative in NAD Head:  Normocephalic and atraumatic. Eyes:  Sclera clear, no icterus.   Conjunctiva pink. Ears:  Normal auditory acuity. Nose:  No deformity, discharge,  or lesions. Mouth:  No deformity or lesions, dentition normal. Neck:  Supple; no masses or thyromegaly. Lungs:  Clear throughout to auscultation.   No wheezes, crackles, or rhonchi. No acute distress. Heart:  Regular rate and rhythm; no murmurs, clicks, rubs,  or gallops. Abdomen:  Soft, nontender and nondistended. No masses, hepatosplenomegaly or hernias noted. Normal bowel sounds, without guarding, and without rebound.   Msk:  Symmetrical without gross deformities. Normal posture. Extremities:  Without clubbing or edema. Neurologic:  Alert and  oriented x4;  grossly normal neurologically. Skin:  Intact without significant lesions or rashes. Cervical Nodes:  No significant cervical adenopathy. Psych:  Alert and cooperative. Normal mood and affect.  Impression/Plan: Monica Young is here for an EGD for GERD and colonoscopy to be performed for colitis, diarrhea, rectal bleeding.   The risks of the procedure including infection, bleed, or perforation as well as benefits, limitations, alternatives and imponderables have been reviewed with the patient. Questions have been answered. All parties agreeable.

## 2021-01-03 NOTE — Op Note (Signed)
[facility] Patient Name: [facility] Procedure Date: 01/03/2021 8:29 AM MRN: [id number] Date of Birth: 1980-11-25 Attending MD: Elon Alas. Abbey Chatters DO CSN: [id number] Age: 40 Admit Type: Outpatient Procedure:                Upper GI endoscopy Indications:              Dysphagia, Heartburn Providers:                Elon Alas. Abbey Chatters, DO, Gwenlyn Fudge, RN, Aram Candela Referring MD:              Medicines:                See the Anesthesia note for documentation of the                            administered medications Complications:            No immediate complications. Estimated Blood Loss:     Estimated blood loss was minimal. Procedure:                Pre-Anesthesia Assessment:                           - The anesthesia plan was to use monitored                            anesthesia care (MAC).                           After obtaining informed consent, the endoscope was                            passed under direct vision. Throughout the                            procedure, the patient's blood pressure, pulse, and                            oxygen saturations were monitored continuously. The                            GIF-H190 ([id number]) scope was introduced through the                            mouth, and advanced to the second part of duodenum.                            The upper GI endoscopy was accomplished without                            difficulty. The patient tolerated the procedure                            well. Scope In: 8:48:26 AM Scope Out:  8:54:08 AM Total Procedure Duration: 0 hours 5 minutes 42 seconds  Findings:      There is no endoscopic evidence of Barrett's esophagus, esophagitis,       hiatal hernia, ulcerations or varices in the entire esophagus.      One benign-appearing, intrinsic mild stenosis was found in the lower       third of the esophagus. The stenosis was traversed. A TTS dilator was       passed  through the scope. Dilation with an 18-19-20 mm balloon dilator       was performed to 20 mm. The dilation site was examined and showed       moderate improvement in luminal narrowing.      Biopsies were taken with a cold forceps in the middle third of the       esophagus for histology.      Localized mild inflammation characterized by erythema was found in the       gastric antrum. Biopsies were taken with a cold forceps for Helicobacter       pylori testing.      The duodenal bulb, first portion of the duodenum and second portion of       the duodenum were normal. Biopsies for histology were taken with a cold       forceps for evaluation of celiac disease. Impression:               - Benign-appearing esophageal stenosis. Dilated.                           - Gastritis. Biopsied.                           - Normal duodenal bulb, first portion of the                            duodenum and second portion of the duodenum.                            Biopsied.                           - Biopsies were taken with a cold forceps for                            histology in the middle third of the esophagus. Moderate Sedation:      Per Anesthesia Care Recommendation:           - Patient has a contact number available for                            emergencies. The signs and symptoms of potential                            delayed complications were discussed with the                            patient. Return to normal activities tomorrow.  Written discharge instructions were provided to the                            patient.                           - Resume previous diet.                           - Continue present medications.                           - Await pathology results.                           - Repeat upper endoscopy PRN for retreatment.                           - Return to GI clinic in 3 months.                           - Use a proton pump  inhibitor PO BID for 8 weeks                            then decrease to once daily. Procedure Code(s):        --- Professional ---                           [phone], Esophagogastroduodenoscopy, flexible,                            transoral; with transendoscopic balloon dilation of                            esophagus (less than 30 mm diameter)                           [id number], 59, Esophagogastroduodenoscopy, flexible,                            transoral; with biopsy, single or multiple Diagnosis Code(s):        --- Professional ---                           K22.2, Esophageal obstruction                           K29.70, Gastritis, unspecified, without bleeding                           R13.10, Dysphagia, unspecified                           R12, Heartburn CPT copyright 2019 American Medical Association. All rights reserved. The codes documented in this report are preliminary and upon coder review may  be revised to meet current compliance requirements. Elon Alas. Abbey Chatters, DO [place], DO 01/03/2021  8:58:01 AM This report has been signed electronically. Number of Addenda: 0

## 2021-01-03 NOTE — Anesthesia Postprocedure Evaluation (Signed)
Anesthesia Post Note  Patient: Monica Young  Procedure(s) Performed: COLONOSCOPY WITH PROPOFOL (N/A ) ESOPHAGOGASTRODUODENOSCOPY (EGD) WITH PROPOFOL (N/A ) BALLOON DILATION BIOPSY  Patient location during evaluation: PACU Anesthesia Type: General Level of consciousness: awake and alert Pain management: pain level controlled Vital Signs Assessment: post-procedure vital signs reviewed and stable Respiratory status: spontaneous breathing Cardiovascular status: blood pressure returned to baseline and stable Postop Assessment: no apparent nausea or vomiting Anesthetic complications: no   No complications documented.   Last Vitals:  Vitals:   01/03/21 0730 01/03/21 0918  BP: (!) 110/56 106/87  Pulse: 80 79  Resp: 17 16  Temp: 36.8 C 36.8 C  SpO2: 100% 99%    Last Pain:  Vitals:   01/03/21 0918  TempSrc: Oral  PainSc: 0-No pain                 Nada Godley

## 2021-01-03 NOTE — Anesthesia Preprocedure Evaluation (Addendum)
Anesthesia Evaluation  Patient identified by MRN, date of birth, ID band Patient awake    Reviewed: Allergy & Precautions, NPO status , Patient's Chart, lab work & pertinent test results  History of Anesthesia Complications (+) PONVNegative for: history of anesthetic complications  Airway Mallampati: II  TM Distance: >3 FB Neck ROM: Full    Dental  (+) Dental Advisory Given, Teeth Intact   Pulmonary    Pulmonary exam normal breath sounds clear to auscultation       Cardiovascular Exercise Tolerance: Good Normal cardiovascular exam Rhythm:Regular Rate:Normal     Neuro/Psych  Headaches, PSYCHIATRIC DISORDERS Anxiety Depression    GI/Hepatic Neg liver ROS, GERD  Medicated and Controlled,  Endo/Other  negative endocrine ROS  Renal/GU negative Renal ROS     Musculoskeletal negative musculoskeletal ROS (+)   Abdominal   Peds  Hematology  (+) anemia ,   Anesthesia Other Findings   Reproductive/Obstetrics negative OB ROS                            Anesthesia Physical Anesthesia Plan  ASA: II  Anesthesia Plan: General   Post-op Pain Management:    Induction: Intravenous  PONV Risk Score and Plan: Propofol infusion  Airway Management Planned: Nasal Cannula and Natural Airway  Additional Equipment:   Intra-op Plan:   Post-operative Plan:   Informed Consent: I have reviewed the patients History and Physical, chart, labs and discussed the procedure including the risks, benefits and alternatives for the proposed anesthesia with the patient or authorized representative who has indicated his/her understanding and acceptance.     Dental advisory given  Plan Discussed with: CRNA and Surgeon  Anesthesia Plan Comments:         Anesthesia Quick Evaluation

## 2021-01-03 NOTE — Discharge Instructions (Addendum)
EGD Discharge instructions Please read the instructions outlined below and refer to this sheet in the next few weeks. These discharge instructions provide you with general information on caring for yourself after you leave the hospital. Your doctor may also give you specific instructions. While your treatment has been planned according to the most current medical practices available, unavoidable complications occasionally occur. If you have any problems or questions after discharge, please call your doctor. ACTIVITY  You may resume your regular activity but move at a slower pace for the next 24 hours.   Take frequent rest periods for the next 24 hours.   Walking will help expel (get rid of) the air and reduce the bloated feeling in your abdomen.   No driving for 24 hours (because of the anesthesia (medicine) used during the test).   You may shower.   Do not sign any important legal documents or operate any machinery for 24 hours (because of the anesthesia used during the test).  NUTRITION  Drink plenty of fluids.   You may resume your normal diet.   Begin with a light meal and progress to your normal diet.   Avoid alcoholic beverages for 24 hours or as instructed by your caregiver.  MEDICATIONS  You may resume your normal medications unless your caregiver tells you otherwise.  WHAT YOU CAN EXPECT TODAY  You may experience abdominal discomfort such as a feeling of fullness or "gas" pains.  FOLLOW-UP  Your doctor will discuss the results of your test with you.  SEEK IMMEDIATE MEDICAL ATTENTION IF ANY OF THE FOLLOWING OCCUR:  Excessive nausea (feeling sick to your stomach) and/or vomiting.   Severe abdominal pain and distention (swelling).   Trouble swallowing.   Temperature over 101 F (37.8 C).   Rectal bleeding or vomiting of blood.    Colonoscopy Discharge Instructions  Read the instructions outlined below and refer to this sheet in the next few weeks. These  discharge instructions provide you with general information on caring for yourself after you leave the hospital. Your doctor may also give you specific instructions. While your treatment has been planned according to the most current medical practices available, unavoidable complications occasionally occur.   ACTIVITY  You may resume your regular activity, but move at a slower pace for the next 24 hours.   Take frequent rest periods for the next 24 hours.   Walking will help get rid of the air and reduce the bloated feeling in your belly (abdomen).   No driving for 24 hours (because of the medicine (anesthesia) used during the test).    Do not sign any important legal documents or operate any machinery for 24 hours (because of the anesthesia used during the test).  NUTRITION  Drink plenty of fluids.   You may resume your normal diet as instructed by your doctor.   Begin with a light meal and progress to your normal diet. Heavy or fried foods are harder to digest and may make you feel sick to your stomach (nauseated).   Avoid alcoholic beverages for 24 hours or as instructed.  MEDICATIONS  You may resume your normal medications unless your doctor tells you otherwise.  WHAT YOU CAN EXPECT TODAY  Some feelings of bloating in the abdomen.   Passage of more gas than usual.   Spotting of blood in your stool or on the toilet paper.  IF YOU HAD POLYPS REMOVED DURING THE COLONOSCOPY:  No aspirin products for 7 days or as instructed.  No alcohol for 7 days or as instructed.   Eat a soft diet for the next 24 hours.  FINDING OUT THE RESULTS OF YOUR TEST Not all test results are available during your visit. If your test results are not back during the visit, make an appointment with your caregiver to find out the results. Do not assume everything is normal if you have not heard from your caregiver or the medical facility. It is important for you to follow up on all of your test results.   SEEK IMMEDIATE MEDICAL ATTENTION IF:  You have more than a spotting of blood in your stool.   Your belly is swollen (abdominal distention).   You are nauseated or vomiting.   You have a temperature over 101.   You have abdominal pain or discomfort that is severe or gets worse throughout the day.   Your EGD showed a mild amount of inflammation in your stomach.  I biopsied this to rule out infection with a bacteria called H. pylori.  Also took biopsies of your esophagus and small bowel as well to rule out underlying conditions.  Your esophagus had a slight narrowing so I did stretch it with a balloon today.  Hopefully this helps with your swallowing.  Await pathology results my office will contact you.  I want you to take omeprazole 20 mg twice daily for the next 8 weeks and then decrease to once daily thereafter.  I sent this to your pharmacy.  Avoid NSAIDs as best as you can.  Your colon looked very healthy.  I did not see any active inflammation throughout the entire colon.  Your terminal ileum also appeared normal which is where Crohn's disease is typically noted.  I did take biopsies of your entire colon as well to make sure there is not underlying inflammation that I could not see with the naked eye.  Likely your previously noted colitis was infectious and has resolved.  Follow-up with GI in 2 to 3 months or sooner if needed.  I hope you have a great rest of your week!  Hennie Duos. Marletta Lor, D.O. Gastroenterology and Hepatology [facility]

## 2021-01-06 LAB — SURGICAL PATHOLOGY

## 2021-01-07 ENCOUNTER — Encounter (HOSPITAL_COMMUNITY): Payer: Self-pay | Admitting: Internal Medicine

## 2021-01-27 ENCOUNTER — Telehealth: Payer: Self-pay | Admitting: Family Medicine

## 2021-01-27 ENCOUNTER — Other Ambulatory Visit: Payer: Self-pay | Admitting: *Deleted

## 2021-01-27 MED ORDER — FLUCONAZOLE 150 MG PO TABS: ORAL_TABLET | ORAL | 0 refills | Status: DC

## 2021-01-27 NOTE — Telephone Encounter (Signed)
Patient is requesting something for yeast infection,she tried over the counter medication but not helping.[facility]

## 2021-01-27 NOTE — Telephone Encounter (Signed)
Pt having white discharge. Diflucan 150mg  #2 one po 3 days apart sent to pharm per protocol and pt was notified to follow up in office if not better after treatment.

## 2021-02-24 ENCOUNTER — Encounter: Payer: Self-pay | Admitting: Gastroenterology

## 2021-02-24 ENCOUNTER — Encounter: Payer: Self-pay | Admitting: Internal Medicine

## 2021-04-03 ENCOUNTER — Ambulatory Visit: Payer: BC Managed Care – PPO | Admitting: Gastroenterology

## 2021-04-14 ENCOUNTER — Other Ambulatory Visit: Payer: Self-pay | Admitting: Nurse Practitioner

## 2021-04-14 ENCOUNTER — Encounter: Payer: Self-pay | Admitting: Nurse Practitioner

## 2021-04-15 ENCOUNTER — Telehealth: Payer: Self-pay

## 2021-04-15 NOTE — Telephone Encounter (Signed)
Left message letting her know an appt is needed in the next 2 weeks or so to discuss further.

## 2021-04-15 NOTE — Telephone Encounter (Signed)
Patient called in a refill for zoloft 50 mg 1 q day , she has previously been on this medication and had stopped it after Covid around 11/2020. She is now experiencing some symptoms of anxiety w/ no depression at this time. Informed patient an appointment will be required in the near future for refill authorizations.

## 2021-04-15 NOTE — Telephone Encounter (Signed)
Nurses a few things  #1 lets make sure that she is not suicidal. #2 I am okay with sending in a initial prescription once I know specifically the reason why she needs to restart the medicine And also verification that she is not suicidal. Then I would like to do a visit with her either by phone or in person this week or next week.  Please find this out then we go from there

## 2021-04-15 NOTE — Telephone Encounter (Signed)
May have appointment within the next 2 weeks to discuss further

## 2021-04-17 ENCOUNTER — Other Ambulatory Visit: Payer: Self-pay | Admitting: Family Medicine

## 2021-04-17 MED ORDER — FLUCONAZOLE 150 MG PO TABS: ORAL_TABLET | ORAL | 0 refills | Status: DC

## 2021-04-30 ENCOUNTER — Telehealth: Payer: Self-pay

## 2021-04-30 NOTE — Telephone Encounter (Signed)
Patient called and would like to have zoloft called in prior to 7/11 appt . She has been off of it since January of 2022. She is having some anxiety with infrequent bouts of depression.not suicidal .  Patient ask if she needs follow up appt after she takes zoloft for 2 weeks or does she need to keep 7/11/ appt . Please advise

## 2021-05-02 ENCOUNTER — Other Ambulatory Visit: Payer: Self-pay | Admitting: Nurse Practitioner

## 2021-05-02 MED ORDER — SERTRALINE HCL 50 MG PO TABS: 50.0000 mg | ORAL_TABLET | Freq: Every day | ORAL | 0 refills | Status: DC

## 2021-05-02 NOTE — Telephone Encounter (Signed)
Patient has been informed per providers recommendations. She verbalizes understanding.

## 2021-05-05 ENCOUNTER — Other Ambulatory Visit: Payer: Self-pay

## 2021-05-05 ENCOUNTER — Encounter: Payer: Self-pay | Admitting: Family Medicine

## 2021-05-05 ENCOUNTER — Ambulatory Visit: Payer: BC Managed Care – PPO | Admitting: Family Medicine

## 2021-05-05 VITALS — BP 115/78 | HR 77 | Temp 98.4°F | Ht 64.0 in | Wt 146.0 lb

## 2021-05-05 DIAGNOSIS — F439 Reaction to severe stress, unspecified: Secondary | ICD-10-CM

## 2021-05-05 MED ORDER — FLUCONAZOLE 150 MG PO TABS: ORAL_TABLET | ORAL | 3 refills | Status: DC

## 2021-05-05 MED ORDER — SERTRALINE HCL 50 MG PO TABS: 50.0000 mg | ORAL_TABLET | Freq: Every day | ORAL | 1 refills | Status: DC

## 2021-05-05 NOTE — Progress Notes (Signed)
   Subjective:    Patient ID: Monica Young, female    DOB: [birth year], 40 y.o.   MRN: [id number]  HPIpt states she has had 3 yeast infections this year and thinks she is getting another one. Having white/clear discharge and itching. Goes away after taking diflucan.   Pt stopped zoloft after having covid back in January. Wants to start back on zoloft.  States she did better when she was taking the Zoloft allowed her to stay more even denies being depressed  Review of Systems     Objective:   Physical Exam  General-in no acute distress Eyes-no discharge Lungs-respiratory rate normal, CTA CV-no murmurs,RRR Extremities skin warm dry no edema Neuro grossly normal Behavior normal, alert       Assessment & Plan:   GAD/stress-continue Zoloft reinitiate if any problems notify us follow-up within 6 months give [place] feedback in several weeks if not improving follow-up sooner if any issues  I did tell her if she starts finding her self feeling depressed or worse to follow-up immediately patient not suicidal currently

## 2021-05-12 ENCOUNTER — Other Ambulatory Visit: Payer: Self-pay | Admitting: Women's Health

## 2021-05-13 ENCOUNTER — Other Ambulatory Visit: Payer: Self-pay

## 2021-05-13 ENCOUNTER — Other Ambulatory Visit (HOSPITAL_COMMUNITY)
Admission: RE | Admit: 2021-05-13 | Discharge: 2021-05-13 | Disposition: A | Discharge status: Home / Self Care | Payer: BC Managed Care – PPO | Source: Ambulatory Visit | Attending: Obstetrics & Gynecology | Admitting: Obstetrics & Gynecology

## 2021-05-13 ENCOUNTER — Ambulatory Visit: Payer: BC Managed Care – PPO | Admitting: Women's Health

## 2021-05-13 ENCOUNTER — Ambulatory Visit (INDEPENDENT_AMBULATORY_CARE_PROVIDER_SITE_OTHER): Payer: BC Managed Care – PPO | Admitting: Women's Health

## 2021-05-13 ENCOUNTER — Encounter: Payer: Self-pay | Admitting: Women's Health

## 2021-05-13 VITALS — BP 114/71 | HR 77 | Ht 64.0 in | Wt 145.5 lb

## 2021-05-13 DIAGNOSIS — Z01419 Encounter for gynecological examination (general) (routine) without abnormal findings: Secondary | ICD-10-CM | POA: Insufficient documentation

## 2021-05-13 NOTE — Progress Notes (Signed)
WELL-WOMAN EXAMINATION Patient name: Monica Young MRN [id number]  Date of birth: Nov 25, 1980 Chief Complaint:   Gynecologic Exam  History of Present Illness:   KIAUNA Young is a 40 y.o. G75P2012 Caucasian female being seen today for a routine well-woman exam. Initially scheduled for IUD removal/reinsertion, but Mirena still good for 1 more year.  Current complaints: none  PCP: [facility]      does not desire labs No LMP recorded. (Menstrual status: IUD). The current method of family planning is IUD, Mirena inserted 05/08/15 Last pap 12/20/17. Results were: NILM w/ HRHPV negative. H/O abnormal pap: yes long time ago Last mammogram: 2021 d/t pain. Results were: normal. Family h/o breast cancer: no Last colonoscopy: March d/t IBS. Results were: normal. Family h/o colorectal cancer: no  Depression screen [facility] 2/9 05/05/2021 11/15/2020 06/21/2020 12/22/2018 12/20/2017  Decreased Interest 1 2 1  0 0  Down, Depressed, Hopeless 0 1 1 0 0  PHQ - 2 Score 1 3 2  0 0  Altered sleeping 1 3 3 1  -  Tired, decreased energy 1 3 3 1  -  Change in appetite 0 0 0 0 -  Feeling bad or failure about yourself  1 3 1  0 -  Trouble concentrating 0 1 0 0 -  Moving slowly or fidgety/restless 0 1 0 0 -  Suicidal thoughts 0 0 0 0 -  PHQ-9 Score 4 14 9 2  -  Difficult doing work/chores - Somewhat difficult Somewhat difficult Not difficult at all -     GAD 7 : Generalized Anxiety Score 05/05/2021 06/21/2020  Nervous, Anxious, on Edge 2 3  Control/stop worrying 2 3  Worry too much - different things 3 3  Trouble relaxing 2 0  Restless 1 0  Easily annoyed or irritable 1 3  Afraid - awful might happen 2 3  Total GAD 7 Score 13 15  Anxiety Difficulty Somewhat difficult Not difficult at all     Review of Systems:   Pertinent items are noted in HPI Denies any headaches, blurred vision, fatigue, shortness of breath, chest pain, abdominal pain, abnormal vaginal discharge/itching/odor/irritation, problems with periods,  bowel movements, urination, or intercourse unless otherwise stated above. Pertinent History Reviewed:  Reviewed past medical,surgical, social and family history.  Reviewed problem list, medications and allergies. Physical Assessment:   Vitals:   05/13/21 1558  BP: 114/71  Pulse: 77  Weight: 145 lb 8 oz (66 kg)  Height: 5\' 4"  (1.626 m)  Body mass index is 24.98 kg/m.        Physical Examination:   General appearance - well appearing, and in no distress  Mental status - alert, oriented to person, place, and time  Psych:  She has a normal mood and affect  Skin - warm and dry, normal color, no suspicious lesions noted  Chest - effort normal, all lung fields clear to auscultation bilaterally  Heart - normal rate and regular rhythm  Neck:  midline trachea, no thyromegaly or nodules  Breasts - breasts appear normal, no suspicious masses, no skin or nipple changes or  axillary nodes  Abdomen - soft, nontender, nondistended, no masses or organomegaly  Pelvic - VULVA: normal appearing vulva with no masses, tenderness or lesions  VAGINA: normal appearing vagina with normal color and discharge, no lesions  CERVIX: normal appearing cervix without discharge or lesions, no CMT, IUD strings visible  Thin prep pap is done w/ HR HPV cotesting  UTERUS: uterus is felt to be normal size, shape, consistency and  nontender   ADNEXA: No adnexal masses or tenderness noted.  Extremities:  No swelling or varicosities noted  Chaperone:  Anna, MS     No results found for this or any previous visit (from the past 24 hour(s)).  Assessment & Plan:  1) Well-Woman Exam  Labs/procedures today: pap  Mammogram: @ 38yo, or sooner if problems Colonoscopy: @ 38yo, or sooner if problems  No orders of the defined types were placed in this encounter.   Meds: No orders of the defined types were placed in this encounter.   Follow-up: Return in about 1 year (around 05/13/2022) for Physical.  Cheral Marker CNM,  [facility] 05/13/2021 4:19 PM

## 2021-05-13 NOTE — Progress Notes (Signed)
See my previous note

## 2021-05-19 LAB — CYTOLOGY - PAP
Adequacy: ABSENT
Comment: NEGATIVE
Diagnosis: NEGATIVE
High risk HPV: NEGATIVE

## 2021-06-18 ENCOUNTER — Ambulatory Visit: Payer: BC Managed Care – PPO | Admitting: Gastroenterology

## 2021-08-13 DIAGNOSIS — L821 Other seborrheic keratosis: Secondary | ICD-10-CM | POA: Diagnosis not present

## 2021-08-13 DIAGNOSIS — D225 Melanocytic nevi of trunk: Secondary | ICD-10-CM | POA: Diagnosis not present

## 2021-09-23 ENCOUNTER — Encounter: Payer: Self-pay | Admitting: Family Medicine

## 2021-10-31 ENCOUNTER — Other Ambulatory Visit: Payer: Self-pay | Admitting: Family Medicine

## 2021-10-31 MED ORDER — ONDANSETRON HCL 8 MG PO TABS: 8.0000 mg | ORAL_TABLET | Freq: Three times a day (TID) | ORAL | 2 refills | Status: DC | PRN

## 2021-10-31 NOTE — Telephone Encounter (Signed)
Verify with pt she wants this since it was removed from her med list If she does want it 6 rf

## 2021-11-04 ENCOUNTER — Other Ambulatory Visit: Payer: Self-pay | Admitting: Family Medicine

## 2021-11-04 MED ORDER — ONDANSETRON HCL 8 MG PO TABS: 8.0000 mg | ORAL_TABLET | Freq: Three times a day (TID) | ORAL | 2 refills | Status: DC | PRN

## 2021-11-04 NOTE — Telephone Encounter (Signed)
Pt called and stated her refill for Zofran, states she called last week, was not at the pharmacy, [facility]. She would like it refilled. She cancelled her med f/u visit for 11/27/21 with Dr Nicki Reaper. Not sure if this was in re to the Zofran itself. [phone].

## 2021-11-05 ENCOUNTER — Ambulatory Visit: Payer: BC Managed Care – PPO | Admitting: Family Medicine

## 2021-11-05 ENCOUNTER — Telehealth: Payer: Self-pay | Admitting: Women's Health

## 2021-11-05 MED ORDER — FLUCONAZOLE 150 MG PO TABS: ORAL_TABLET | ORAL | 1 refills | Status: DC

## 2021-11-05 NOTE — Telephone Encounter (Signed)
Will send in rx for diflucan

## 2021-11-05 NOTE — Telephone Encounter (Signed)
Called patient back. She is having thick clumpy discharge and itching. Would like diflucan sent in to [facility]. Advised patient I would route message to [facility] and she can check with pharmacy later today.

## 2021-11-05 NOTE — Addendum Note (Signed)
Addended by: Cyril Mourning A on: 11/05/2021 01:05 PM   Modules accepted: Orders

## 2021-11-05 NOTE — Telephone Encounter (Signed)
Patient called stating that she needs some diflucan, she thinks she might be getting something. Patient states that she uses [facility] as her pharmacy. Please contact pt

## 2021-11-23 IMAGING — MG DIGITAL DIAGNOSTIC BILAT W/ TOMO W/ CAD
8 series · 9 of 24 positions shown · non-contrast
Comparison: None.

CLINICAL DATA: Nonfocal lateral right breast pain and tenderness
for the past 3 months. She describes pain is extending from the [DATE]
to [DATE] positions of the breast. No family history of breast
cancer.

EXAM:
DIGITAL DIAGNOSTIC BILATERAL MAMMOGRAM WITH CAD AND TOMO

[R MLO synth-2D]
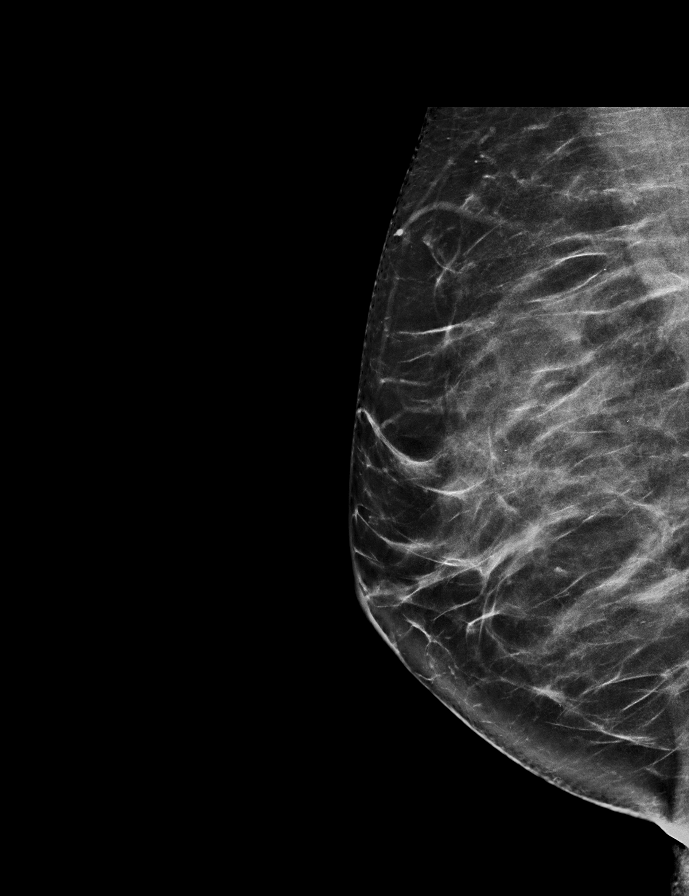

[L MLO synth-2D]
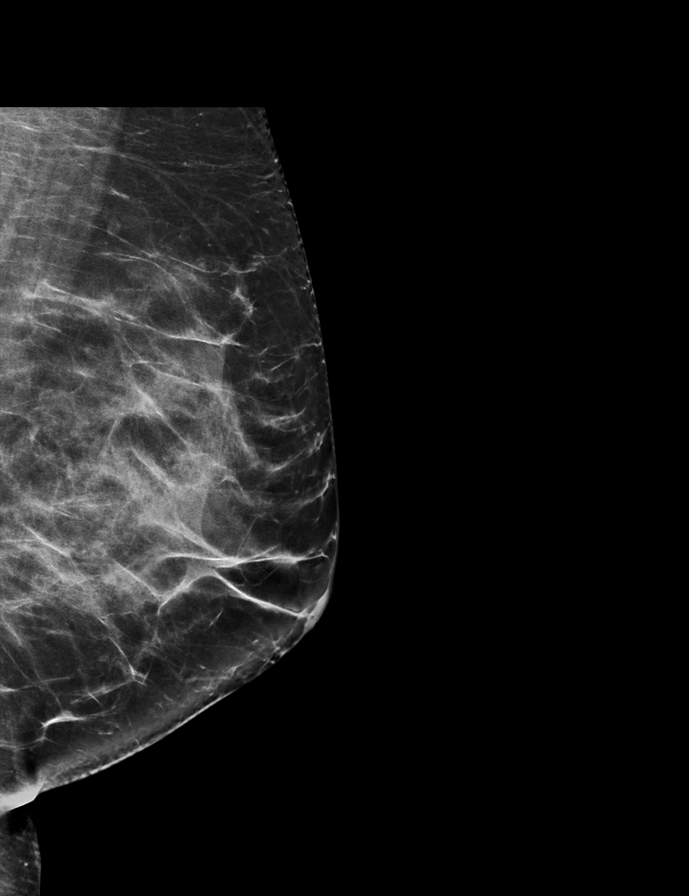

[L CC synth-2D]
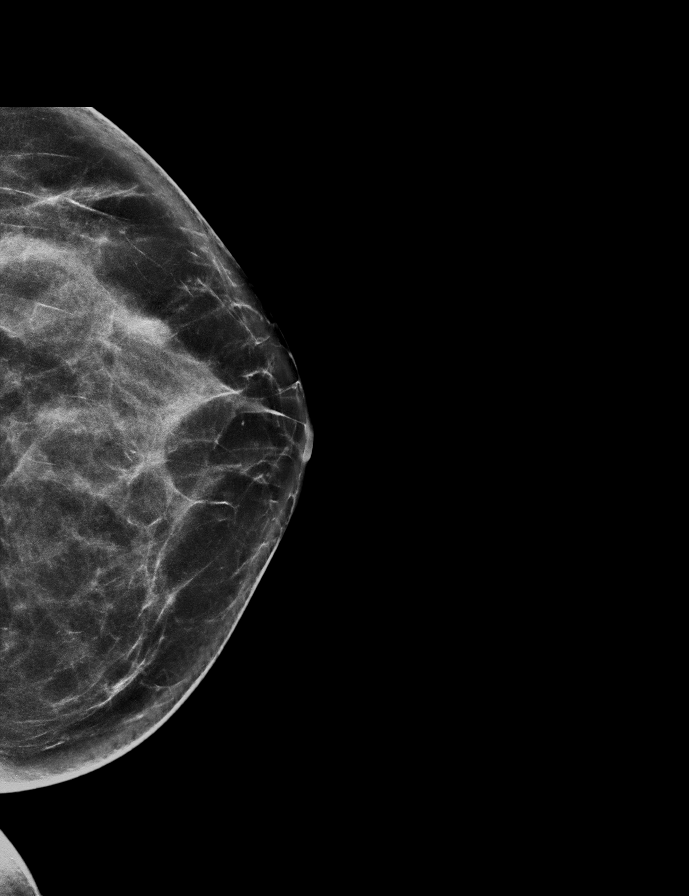

[R CC synth-2D]
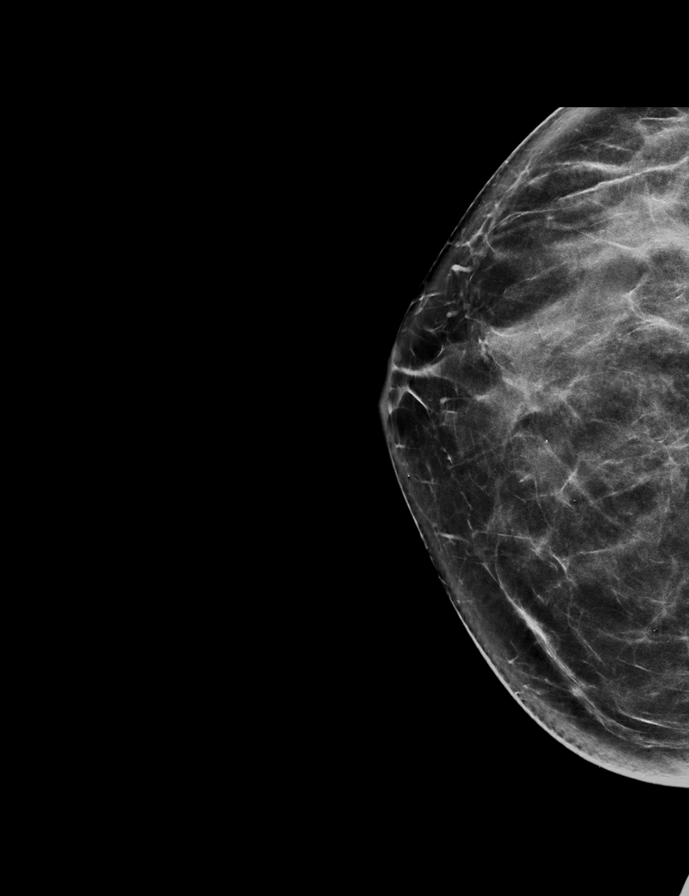

[L CC tomo · 2 of 73 frames shown]
[frame 24/73]
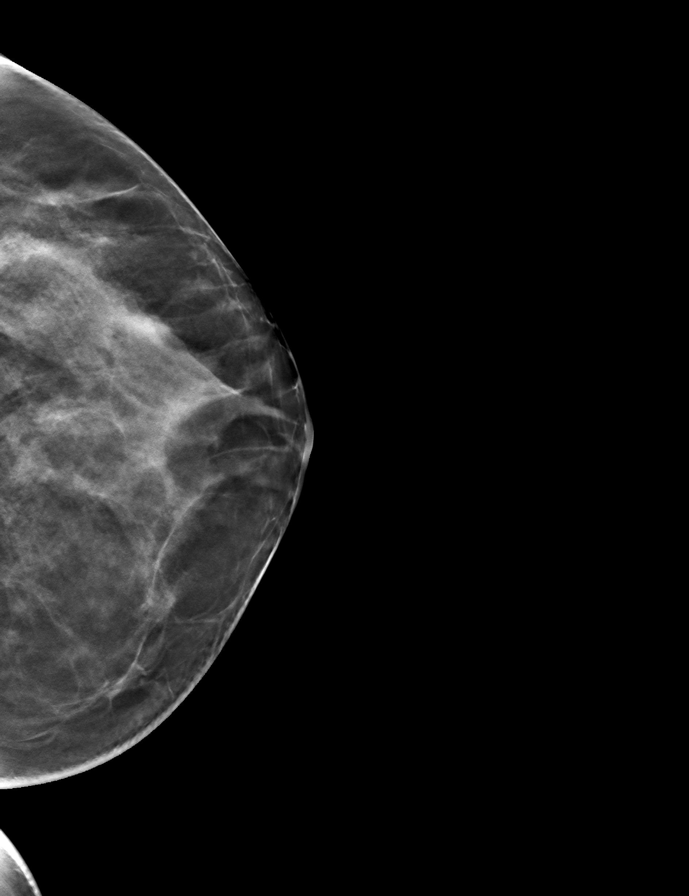
[frame 37/73]
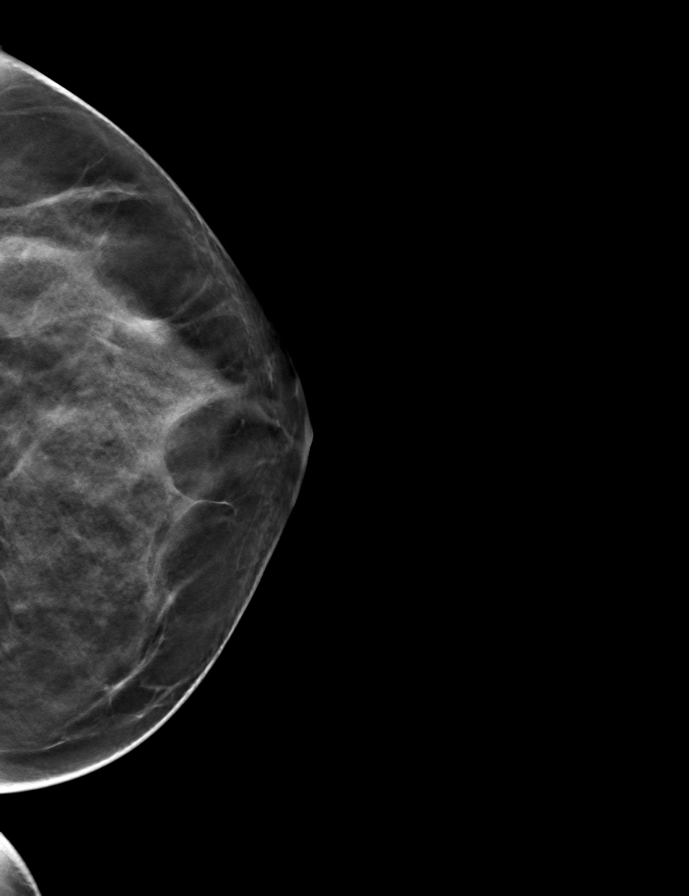

[R MLO tomo · tomo slice 39/76.0]
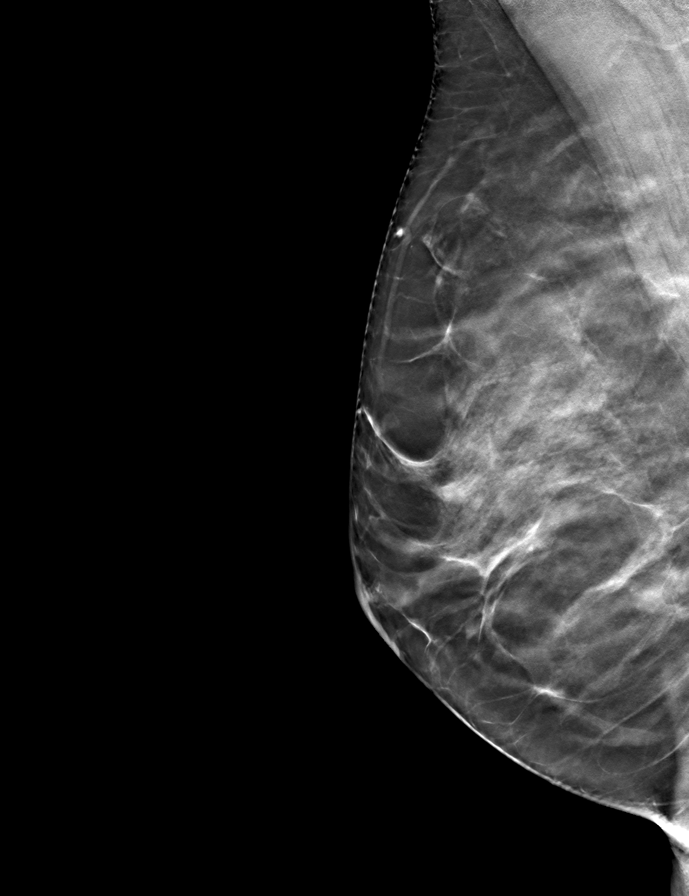

[L MLO tomo · tomo slice 38/75.0]
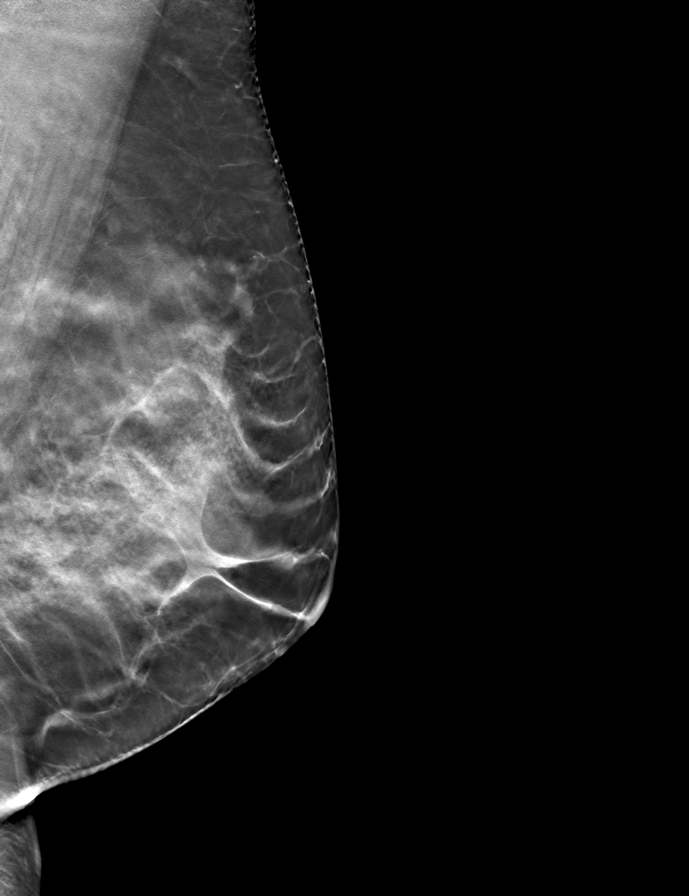

[R CC tomo · tomo slice 35/70.0]
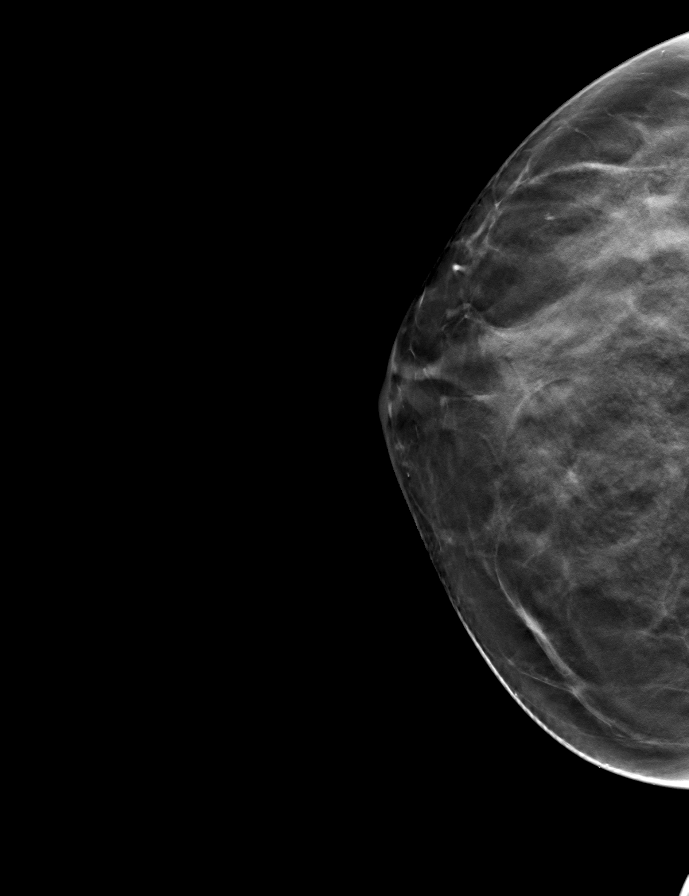

[9 of 24 positions shown; findings below may reference images not displayed]

ACR Breast Density Category c: The breast tissue is heterogeneously
dense, which may obscure small masses.
FINDINGS: Mammographically normal appearing breasts with no findings
suspicious for malignancy in either breast.

Mammographic images were processed with CAD.
IMPRESSION: No evidence of malignancy.

RECOMMENDATION:
Annual screening mammography beginning at age 40.

I have discussed the findings and recommendations with the patient.
If applicable, a reminder letter will be sent to the patient
regarding the next appointment.

BI-RADS CATEGORY  1: Negative.

## 2021-11-27 ENCOUNTER — Ambulatory Visit: Payer: Self-pay | Admitting: Family Medicine

## 2021-12-31 ENCOUNTER — Other Ambulatory Visit: Payer: Self-pay | Admitting: Family Medicine

## 2022-01-21 ENCOUNTER — Telehealth: Payer: Self-pay | Admitting: *Deleted

## 2022-01-21 ENCOUNTER — Other Ambulatory Visit: Payer: Self-pay | Admitting: *Deleted

## 2022-01-21 DIAGNOSIS — R5383 Other fatigue: Secondary | ICD-10-CM

## 2022-01-21 DIAGNOSIS — R635 Abnormal weight gain: Secondary | ICD-10-CM

## 2022-01-21 DIAGNOSIS — Z13220 Encounter for screening for lipoid disorders: Secondary | ICD-10-CM

## 2022-01-21 NOTE — Telephone Encounter (Signed)
Patient informed of md message and recommendations.  Pt verbalized understanding. Labs ordered in epic. ?

## 2022-01-21 NOTE — Telephone Encounter (Signed)
Patient states she has been feeling tired and having weight gain for the past 6-8 months.  She would like to get lab work done if this is possible.  Please advise. Thank you ?

## 2022-01-21 NOTE — Telephone Encounter (Signed)
There are many different reasons that could be playing into this ?I do not mind ordering the lab work ?I would recommend doing lab work but ultimately I would also recommend patient to do a follow-up office visit ?Lipid, met 7, liver, CBC, TSH, free T4 ?Screening hyperlipidemia, other fatigue, weight gain ?

## 2022-01-23 DIAGNOSIS — R5383 Other fatigue: Secondary | ICD-10-CM | POA: Diagnosis not present

## 2022-01-23 DIAGNOSIS — Z13220 Encounter for screening for lipoid disorders: Secondary | ICD-10-CM | POA: Diagnosis not present

## 2022-01-23 DIAGNOSIS — R635 Abnormal weight gain: Secondary | ICD-10-CM | POA: Diagnosis not present

## 2022-01-24 LAB — LIPID PANEL
Chol/HDL Ratio: 3.4 ratio (ref 0.0–4.4)
Cholesterol, Total: 148 mg/dL (ref 100–199)
HDL: 43 mg/dL (ref >39)
LDL Chol Calc (NIH): 94 mg/dL (ref 0–99)
Triglycerides: 50 mg/dL (ref 0–149)
VLDL Cholesterol Cal: 11 mg/dL (ref 5–40)

## 2022-01-24 LAB — CBC WITH DIFFERENTIAL/PLATELET
Basophils Absolute: 0 10*3/uL (ref 0.0–0.2)
Basos: 1 %
EOS (ABSOLUTE): 0 10*3/uL (ref 0.0–0.4)
Eos: 0 %
Hematocrit: 38.2 % (ref 34.0–46.6)
Hemoglobin: 12.6 g/dL (ref 11.1–15.9)
Immature Grans (Abs): 0.1 10*3/uL (ref 0.0–0.1)
Immature Granulocytes: 1 %
Lymphocytes Absolute: 1.6 10*3/uL (ref 0.7–3.1)
Lymphs: 35 %
MCH: 28 pg (ref 26.6–33.0)
MCHC: 33 g/dL (ref 31.5–35.7)
MCV: 85 fL (ref 79–97)
Monocytes Absolute: 0.3 10*3/uL (ref 0.1–0.9)
Monocytes: 7 %
Neutrophils Absolute: 2.6 10*3/uL (ref 1.4–7.0)
Neutrophils: 56 %
Platelets: 295 10*3/uL (ref 150–450)
RBC: 4.5 x10E6/uL (ref 3.77–5.28)
RDW: 12.5 % (ref 11.7–15.4)
WBC: 4.7 10*3/uL (ref 3.4–10.8)

## 2022-01-24 LAB — BASIC METABOLIC PANEL
BUN/Creatinine Ratio: 25 — ABNORMAL HIGH (ref 9–23)
BUN: 13 mg/dL (ref 6–20)
CO2: 26 mmol/L (ref 20–29)
Calcium: 9.2 mg/dL (ref 8.7–10.2)
Chloride: 104 mmol/L (ref 96–106)
Creatinine, Ser: 0.53 mg/dL — ABNORMAL LOW (ref 0.57–1.00)
Glucose: 82 mg/dL (ref 70–99)
Potassium: 4.3 mmol/L (ref 3.5–5.2)
Sodium: 139 mmol/L (ref 134–144)
eGFR: 121 mL/min/{1.73_m2} (ref >59)

## 2022-01-24 LAB — HEPATIC FUNCTION PANEL
ALT: 29 IU/L (ref 0–32)
AST: 29 IU/L (ref 0–40)
Albumin: 4.7 g/dL (ref 3.8–4.8)
Alkaline Phosphatase: 42 IU/L — ABNORMAL LOW (ref 44–121)
Bilirubin Total: 0.4 mg/dL (ref 0.0–1.2)
Bilirubin, Direct: <0.1 mg/dL (ref 0.00–0.40)
Total Protein: 6.6 g/dL (ref 6.0–8.5)

## 2022-01-24 LAB — T4, FREE: Free T4: 1.19 ng/dL (ref 0.82–1.77)

## 2022-01-24 LAB — TSH: TSH: 0.688 u[IU]/mL (ref 0.450–4.500)

## 2022-05-04 DIAGNOSIS — M9261 Juvenile osteochondrosis of tarsus, right ankle: Secondary | ICD-10-CM | POA: Diagnosis not present

## 2022-05-04 DIAGNOSIS — M7661 Achilles tendinitis, right leg: Secondary | ICD-10-CM | POA: Diagnosis not present

## 2022-05-04 DIAGNOSIS — M79671 Pain in right foot: Secondary | ICD-10-CM | POA: Diagnosis not present

## 2022-05-04 DIAGNOSIS — M7751 Other enthesopathy of right foot: Secondary | ICD-10-CM | POA: Diagnosis not present

## 2022-05-11 DIAGNOSIS — M79671 Pain in right foot: Secondary | ICD-10-CM | POA: Diagnosis not present

## 2022-05-11 DIAGNOSIS — M7751 Other enthesopathy of right foot: Secondary | ICD-10-CM | POA: Diagnosis not present

## 2022-05-11 DIAGNOSIS — R2689 Other abnormalities of gait and mobility: Secondary | ICD-10-CM | POA: Diagnosis not present

## 2022-05-11 DIAGNOSIS — M7661 Achilles tendinitis, right leg: Secondary | ICD-10-CM | POA: Diagnosis not present

## 2022-05-11 DIAGNOSIS — R29898 Other symptoms and signs involving the musculoskeletal system: Secondary | ICD-10-CM | POA: Diagnosis not present

## 2022-05-11 DIAGNOSIS — M216X1 Other acquired deformities of right foot: Secondary | ICD-10-CM | POA: Diagnosis not present

## 2022-05-15 ENCOUNTER — Ambulatory Visit: Payer: BC Managed Care – PPO | Admitting: Obstetrics & Gynecology

## 2022-06-04 ENCOUNTER — Ambulatory Visit: Payer: BC Managed Care – PPO | Admitting: Gastroenterology

## 2022-06-04 ENCOUNTER — Encounter: Payer: Self-pay | Admitting: Gastroenterology

## 2022-06-04 VITALS — BP 116/71 | HR 80 | Temp 97.7°F | Ht 63.0 in | Wt 138.4 lb

## 2022-06-04 DIAGNOSIS — R112 Nausea with vomiting, unspecified: Secondary | ICD-10-CM

## 2022-06-04 DIAGNOSIS — R1030 Lower abdominal pain, unspecified: Secondary | ICD-10-CM

## 2022-06-04 DIAGNOSIS — K219 Gastro-esophageal reflux disease without esophagitis: Secondary | ICD-10-CM | POA: Diagnosis not present

## 2022-06-04 DIAGNOSIS — R197 Diarrhea, unspecified: Secondary | ICD-10-CM

## 2022-06-04 MED ORDER — PANTOPRAZOLE SODIUM 40 MG PO TBEC: 40.0000 mg | DELAYED_RELEASE_TABLET | Freq: Two times a day (BID) | ORAL | 3 refills | Status: DC

## 2022-06-04 MED ORDER — DICYCLOMINE HCL 10 MG PO CAPS: 10.0000 mg | ORAL_CAPSULE | Freq: Three times a day (TID) | ORAL | 1 refills | Status: DC

## 2022-06-04 NOTE — Patient Instructions (Addendum)
I am sending prescription for pantoprazole for you to take 40 mg twice daily, 30 minutes prior to breakfast and dinner.  If you forget prior to eating dinner you may take 2 hours after you have your last meal.  Try to avoid any trigger foods which typically are foods that are spicy, greasy, fatty, or acidic foods such as citrus juices or tomato based products.  I am also sending in prescription for Bentyl, this is 10 mg that you may take up to 3 times a day with meals to help with abdominal pain and diarrhea.  If this becomes not helpful, we can explore further your nausea, vomiting, and diarrhea by assessing her gallbladder with an abdominal ultrasound and completing stool studies.  I will have you follow-up in about 10 to 12 weeks, we can do this in person or virtually (I have plenty of virtual spots open if you prefer this).   It was a pleasure to see you today. I want to create trusting relationships with patients. If you receive a survey regarding your visit,  I greatly appreciate you taking time to fill this out on paper or through your MyChart. I value your feedback.  Brooke Bonito, MSN, FNP-BC, AGACNP-BC [facility]

## 2022-06-04 NOTE — Progress Notes (Signed)
GI Office Note    Referring Provider: Babs Sciara, MD Primary Care Physician:  Babs Sciara, MD Primary Gastroenterologist: Dr. Marletta Lor  Date:  06/04/2022  ID:  Monica Young, DOB 11/23/1980, MRN [id number]   Chief Complaint   Chief Complaint  Patient presents with   Abdominal Pain    Nausea and vomiting     History of Present Illness  Monica Young is a 41 y.o. female with a history of nausea/vomiting presenting today with complaint of nausea, vomiting.  Patient is a Therapist, music.  Does have a history of testing positive for COVID without respiratory symptoms in 2022.   Last seen in the office January 2022.  Reported history of asthma, paralysis or stool systolically, not on any chronic medications.  1 month prior she began having nausea/vomiting for few weeks woke up with dry scaly patches under her eyes.  She then began having diarrhea and subsequent abdominal pain followed by bloody diarrhea.  She was placed on Cipro and Flagyl in the ED.  She noted being nauseous and having streaks of blood in her stool the Young prior to her visit.  Reported similar pain and diarrhea 10 years prior but did not remember if it was bloody.  She had recent CT scan in the ED with longstanding inflammatory change involving descending and rectosigmoid colon as well as rectum compatible with infectious or inflammatory proctocolitis.  She reported clinically feeling better overall. Having chronic GERD takes over-the-counter PPI but did notice more burping.  Denies frequent NSAID use, was having coughing after eating, denied dysphagia.  Patient requested EGD at time of colonoscopy.  No stool studies were collected.  EGD and colonoscopy performed 01/03/2021: Benign-appearing esophageal stenosis s/p dilation, gastritis, normal duodenum s/p biopsy.  Colonoscopy with nonbleeding internal hemorrhoids, examined portion of ileum normal s/p biopsy, other random colon biopsies taken.  Stomach biopsy negative  for H. pylori.  Esophageal biopsy with mild vascular congestion and focal squamous ballooning suggestive of reflux esophagitis.  Duodenum and random colon biopsies negative.  Most recent labs 01/23/2022: CBC with stable hemoglobin, stable renal function, normal LFTs, normal lipid panel, normal TSH.  Today:   Has been going on for about a week or so. Has been nauseas and vomiting and some diarrhea but not bloody. No diarrhea. Took a COVID test and a pregnancy test and both were negative. Thinks she may have been running a fever but her thermometer was broke. Does have cramping pain with eating at times and has that until she has diarrhea. She feels like she can feel it moving through. Stools have always been loose. Normally has 1-3 stools in the morning. No nocturnal stools. If having a flare she may have a few at Young as well. Currently they are loose and mushy. Vomit was initially projectile. Emesis has been partially digested food. Has not eaten today and has not been nauseas and no vomiting. Does have an appetite. Has lost some weight this week due to not eating. Has been doing liquids mostly and trying solids but has been regurgitating it. Has zofran at home but has not taken that either. Does have frequent belching.  Thinks meclizine helped her yesterday.   Thinks she may have eaten a bad salad on Sunday.    Current Outpatient Medications  Medication Sig Dispense Refill   acetaminophen (TYLENOL) 500 MG tablet Take 1,000 mg by mouth every 8 (eight) hours as needed for moderate pain.     diphenhydramine-acetaminophen (TYLENOL PM)  25-500 MG TABS tablet Take 1 tablet by mouth at bedtime as needed (sleep).     fluconazole (DIFLUCAN) 150 MG tablet Take 1 now and 1 in 3 days 2 tablet 1   levonorgestrel (MIRENA) 20 MCG/24HR IUD 1 each by Intrauterine route once.     omeprazole (PRILOSEC) 20 MG capsule Take 1 capsule (20 mg total) by mouth 2 (two) times daily before a meal. Take 30 min before  breakfast and 30 min before dinner (Patient not taking: Reported on 05/13/2021) 60 capsule 5   ondansetron (ZOFRAN) 8 MG tablet Take 1 tablet (8 mg total) by mouth every 8 (eight) hours as needed for nausea or vomiting. 15 tablet 2   rizatriptan (MAXALT) 10 MG tablet TAKE 1 TABLET BY MOUTH AS NEEDED FOR MIGRAINE. MAY REPEAT IN TWO HOURS IF NEEDED 10 tablet 6   sertraline (ZOLOFT) 50 MG tablet TAKE 1 TABLET BY MOUTH DAILY 90 tablet 0   No current facility-administered medications for this visit.    Past Medical History:  Diagnosis Date   GERD (gastroesophageal reflux disease)    IBS (irritable bowel syndrome)     Past Surgical History:  Procedure Laterality Date   BALLOON DILATION  01/03/2021   Procedure: BALLOON DILATION;  Surgeon: Eloise Harman, DO;  [facility];  Service: Endoscopy;;   BIOPSY  01/03/2021   Procedure: BIOPSY;  Surgeon: Eloise Harman, DO;  [facility];  Service: Endoscopy;;   COLONOSCOPY WITH PROPOFOL N/A 01/03/2021   Procedure: COLONOSCOPY WITH PROPOFOL;  Surgeon: Eloise Harman, DO;  [facility];  Service: Endoscopy;  Laterality: N/A;  am, pt was covid+ 1/3 <90 days   ESOPHAGOGASTRODUODENOSCOPY (EGD) WITH PROPOFOL N/A 01/03/2021   Procedure: ESOPHAGOGASTRODUODENOSCOPY (EGD) WITH PROPOFOL;  Surgeon: Eloise Harman, DO;  [facility];  Service: Endoscopy;  Laterality: N/A;   None     WISDOM TOOTH EXTRACTION      Family History  Problem Relation Age of Onset   Hypertension Father    Heart disease Father    Pancreatitis Brother    Heart disease Maternal Grandmother    Stroke Maternal Grandfather    Cancer Paternal Grandmother        lung   COPD Paternal Grandfather    Heart disease Paternal Grandfather    Crohn's disease Neg Hx    Ulcerative colitis Neg Hx     Allergies as of 06/04/2022   (No Known Allergies)    Social History   Socioeconomic History   Marital status: Married    Spouse name: Not  on file   Number of children: Not on file   Years of education: Not on file   Highest education level: Not on file  Occupational History   Occupation: [employer]    Comment: hospice nurse  Tobacco Use   Smoking status: Never   Smokeless tobacco: Never  Vaping Use   Vaping Use: Never used  Substance and Sexual Activity   Alcohol use: Yes    Alcohol/week: 0.0 standard drinks of alcohol    Comment: occassional   Drug use: No   Sexual activity: Yes    Birth control/protection: I.U.D.  Other Topics Concern   Not on file  Social History Narrative   Not on file   Social Determinants of Health   Financial Resource Strain: Not on file  Food Insecurity: Not on file  Transportation Needs: Not on file  Physical Activity: Not on file  Stress: Not on file  Social Connections: Not on file     Review of Systems   Gen: Denies fever, chills, anorexia. Denies fatigue, weakness, weight loss.  CV: Denies chest pain, palpitations, syncope, peripheral edema, and claudication. Resp: Denies dyspnea at rest, cough, wheezing, coughing up blood, and pleurisy. GI: See HPI Derm: Denies rash, itching, dry skin Psych: Denies depression, anxiety, memory loss, confusion. No homicidal or suicidal ideation.  Heme: Denies bruising, bleeding, and enlarged lymph nodes.   Physical Exam   BP 116/71 (BP Location: Right Arm, Patient Position: Sitting, Cuff Size: Normal)   Pulse 80   Temp 97.7 F (36.5 C) (Oral)   Ht 5\' 3"  (1.6 m)   Wt 138 lb 6.4 oz (62.8 kg)   BMI 24.52 kg/m   General:   Alert and oriented. No distress noted. Pleasant and cooperative.  Head:  Normocephalic and atraumatic. Eyes:  Conjuctiva clear without scleral icterus. Mouth:  Oral mucosa pink and moist. Good dentition. No lesions. Lungs:  Clear to auscultation bilaterally. No wheezes, rales, or rhonchi. No distress.  Heart:  S1, S2 present without murmurs appreciated.  Abdomen:  +BS, soft, mild epigastric  tenderness and non-distended. No rebound or guarding. No HSM or masses noted. Rectal: deferred Msk:  Symmetrical without gross deformities. Normal posture. Extremities:  Without edema. Neurologic:  Alert and  oriented x4 Psych:  Alert and cooperative. Normal mood and affect.   Assessment  MELANNI BENWAY is a 41 y.o. female with a history of nausea/vomiting presenting today with complaint of nausea, vomiting and abdominal pain.    Nausea/vomiting/diarrhea/abdominal pain/GERD: Patient began experiencing nausea, vomiting, abdominal pain, and worsening diarrhea about 1 week ago.  She did not this past Sunday she felt like she may have eaten a bad salad.  She states this is very similar to what she experienced about a year ago when she had colitis, however she has not had any bloody diarrhea.  She does report history of IBS and has chronically looser stools.  Has not been having any watery stools, denies any recent sick contacts.  She has taken a home pregnancy test and COVID test have been negative.  She reports possibly having a fever earlier this week however her thermometer has not been working.  She states she actually is feeling a little bit better today and is not very nauseous but then again she has not eaten much.  She has vomited up partially digested food over the last few days, but continues to eat and does not have any lack of appetite.  Suspect she may have some viral gastroenteritis and possibly some postinfectious IBS-D flare.  She states she typically has been having abdominal pain with diarrhea after meals however when she has a bowel movement her pain usually resolves.  Some of her symptoms could likely be related to worsening gastritis as this was diagnosed during her EGD last year.  She states she has not been taking omeprazole as she usually forgets to take it.  Advised her to restart PPI, will give pantoprazole 40 mg daily, 30 minutes prior to breakfast and dinner and have also given  her Bentyl 3 times daily with meals to take for her abdominal pain.  I have encouraged her to follow a bland diet for now.  I offered stool studies and abdominal ultrasound and patient would prefer to try medications first.  She does not have symptoms continue or worsen to contact the office and we can definitely perform these to  further evaluate for biliary etiology of her symptoms.  Discussed GERD diet to follow.    PLAN   Pantoprazole 40 mg daily, 30 minutes prior to breakfast and dinner. Bentyl three times a day with meals as needed. Continue Zofran as needed. Bland diet , GERD diet Follow up in 10-12 weeks.  Can perform stool studies and gallbladder ultrasound if symptoms continue.     Monica Night, MSN, FNP-BC, AGACNP-BC [facility]

## 2022-07-21 ENCOUNTER — Other Ambulatory Visit: Payer: Self-pay | Admitting: Family Medicine

## 2022-07-21 NOTE — Telephone Encounter (Signed)
Please contact patient to have her schedule follow up. Then may route back to nurses. Thank you! 

## 2022-07-24 ENCOUNTER — Other Ambulatory Visit: Payer: Self-pay | Admitting: Family Medicine

## 2022-07-24 ENCOUNTER — Encounter: Payer: Self-pay | Admitting: Family Medicine

## 2022-07-27 ENCOUNTER — Other Ambulatory Visit: Payer: Self-pay | Admitting: Family Medicine

## 2022-07-27 MED ORDER — SERTRALINE HCL 50 MG PO TABS: 50.0000 mg | ORAL_TABLET | Freq: Every day | ORAL | 0 refills | Status: DC

## 2022-07-27 NOTE — Telephone Encounter (Signed)
Appt made for 11/09 @ 2:50

## 2022-09-03 ENCOUNTER — Ambulatory Visit: Payer: BC Managed Care – PPO | Admitting: Family Medicine

## 2022-09-21 ENCOUNTER — Encounter: Payer: Self-pay | Admitting: Family Medicine

## 2022-09-21 MED ORDER — FLUCONAZOLE 150 MG PO TABS: ORAL_TABLET | ORAL | 0 refills | Status: DC

## 2022-09-24 ENCOUNTER — Ambulatory Visit: Payer: BC Managed Care – PPO | Admitting: Family Medicine

## 2022-09-24 VITALS — BP 110/62 | HR 85 | Temp 98.1°F | Ht 63.0 in | Wt 132.0 lb

## 2022-09-24 DIAGNOSIS — F439 Reaction to severe stress, unspecified: Secondary | ICD-10-CM | POA: Diagnosis not present

## 2022-09-24 DIAGNOSIS — F419 Anxiety disorder, unspecified: Secondary | ICD-10-CM | POA: Diagnosis not present

## 2022-09-24 MED ORDER — SERTRALINE HCL 50 MG PO TABS: 50.0000 mg | ORAL_TABLET | Freq: Every day | ORAL | 3 refills | Status: DC

## 2022-09-24 NOTE — Progress Notes (Signed)
   Subjective:    Patient ID: Monica Young, female    DOB: [birth year], 41 y.o.   MRN: [id number]  HPI Follow up for anxiety and depression currently on sertraline - refill requested , reports no concerns  Patient has history of anxiety and stress works as a Therapist, music.  Doing well overall.  Keeps up with her female health check up.  Denies any major setbacks.  States overall energy level doing well.  Tries to eat healthy.  Keeps up with preventative health no immediate history of cancers such as breast or colon  Review of Systems     Objective:   Physical Exam Lungs clear heart regular pulse normal neurologic grossly normal       Assessment & Plan:  Stress Doing well Tolerating medicine well Does her female checkups and preventative health on a regular basis We will go ahead with a 1 year renewal of her medicine and a follow-up in 1 years time follow-up sooner problems she did blood work earlier this year does not need to do blood work but can do blood work by next year either through her gynecologist or through [place]

## 2022-11-07 IMAGING — CT CT ABD-PELV W/ CM
2 of 4 series · 16 of 46 positions shown, 18 images · IV contrast (omnipaque)
Comparison: None.

CLINICAL DATA: Diverticulitis

EXAM:
CT ABDOMEN AND PELVIS WITH CONTRAST
TECHNIQUE: Multidetector CT imaging of the abdomen and pelvis was performed
using the standard protocol following bolus administration of
intravenous contrast.
CONTRAST:  100mL OMNIPAQUE IOHEXOL 300 MG/ML  SOLN

[Series 2: axial st · axial · 0.67mm/px · z∈[-635,-195]mm · 13 of 96 slices shown, 15 images]
[im 4/96  soft-tissue]
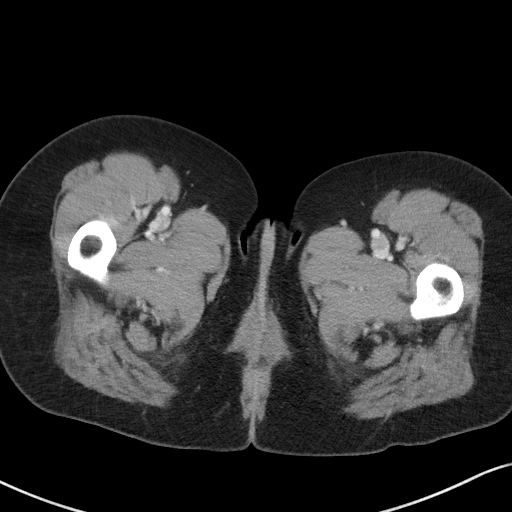
[im 4/96  bone]
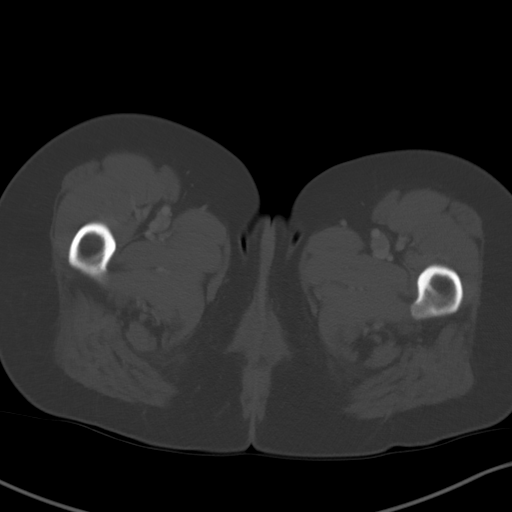
[im 12/96  soft-tissue]
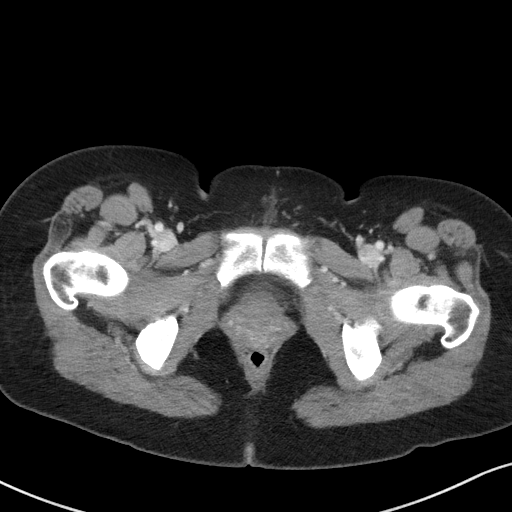
[im 20/96  soft-tissue]
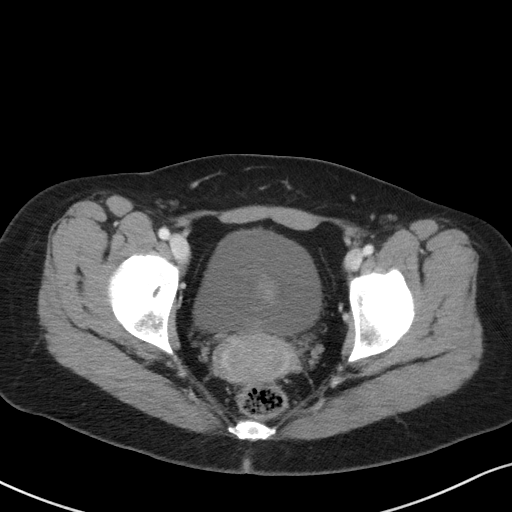
[im 28/96  soft-tissue]
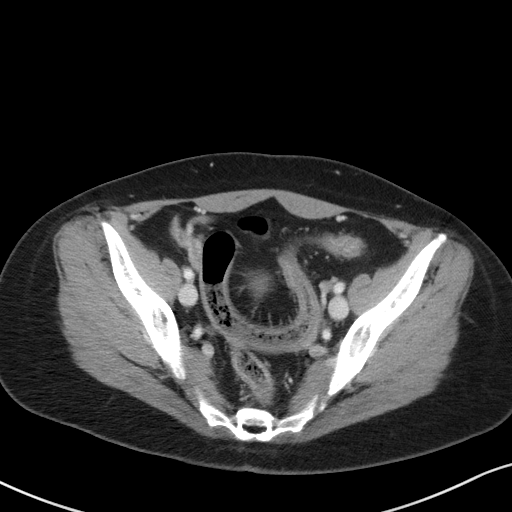
[im 32/96  soft-tissue]
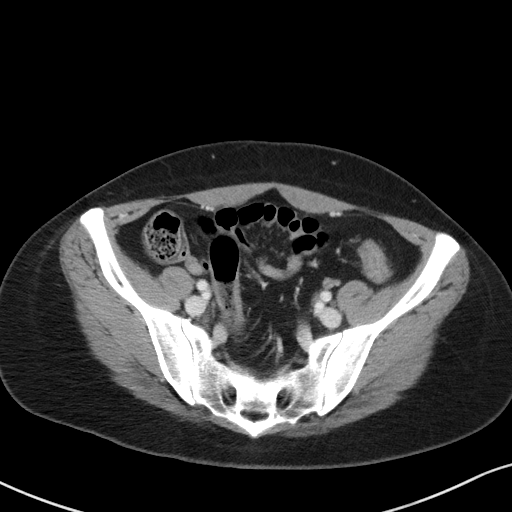
[im 40/96  soft-tissue]
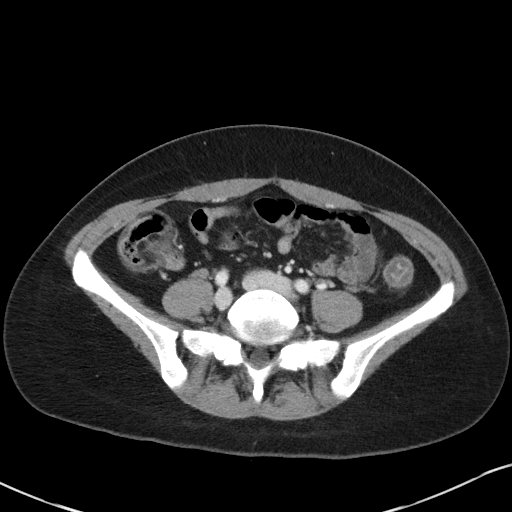
[im 48/96  soft-tissue]
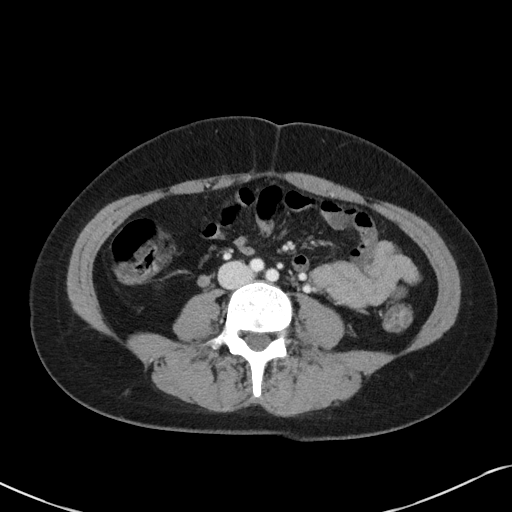
[im 56/96  soft-tissue]
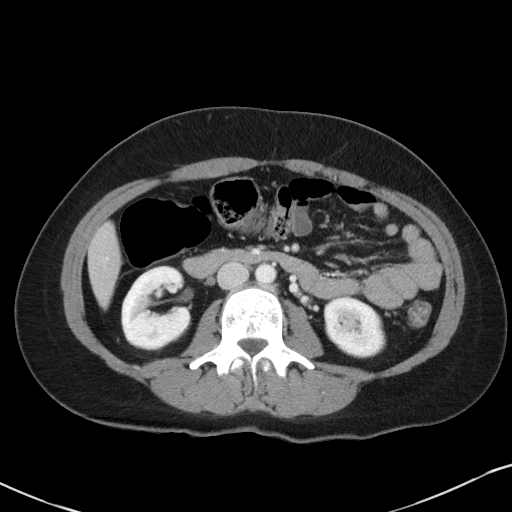
[im 64/96  soft-tissue]
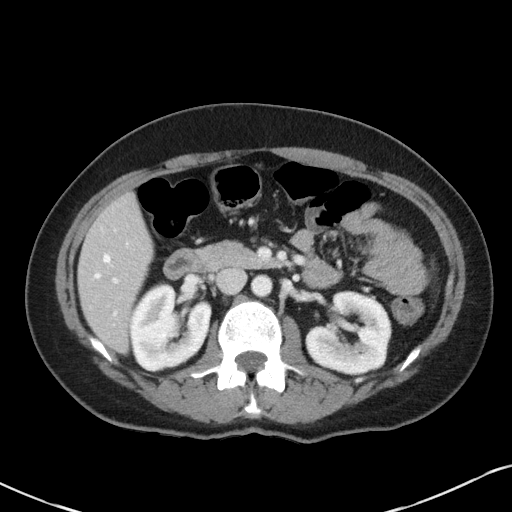
[im 64/96  bone]
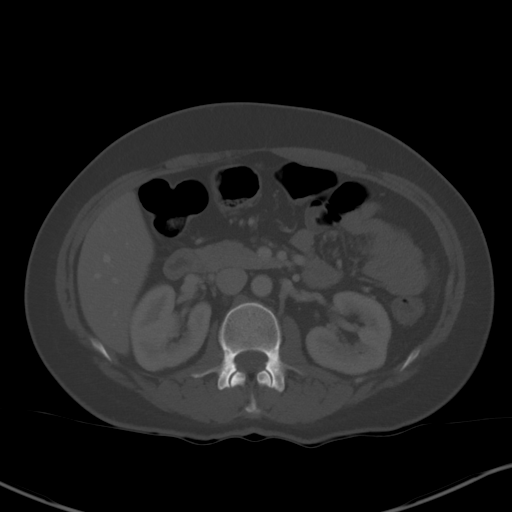
[im 68/96  soft-tissue]
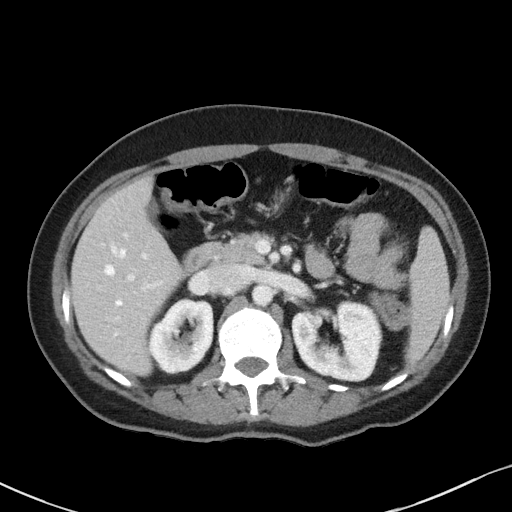
[im 76/96  soft-tissue]
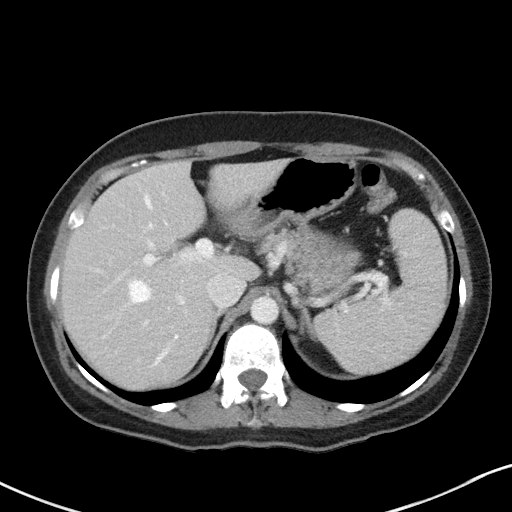
[im 84/96  soft-tissue]
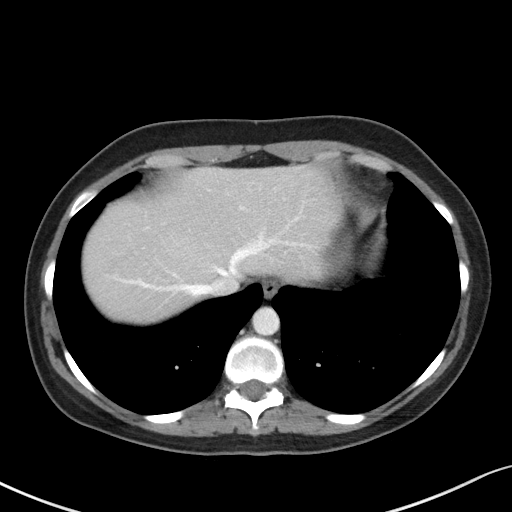
[im 92/96  soft-tissue]
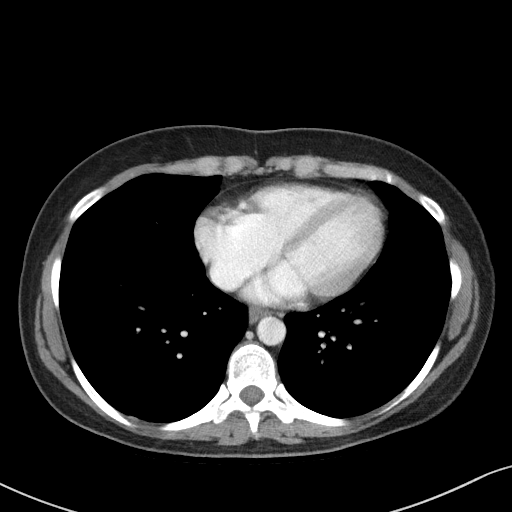

[Series 5: coronal st · coronal · 0.70mm/px · 3 of 89 slices shown]
[im 30/89  soft-tissue]
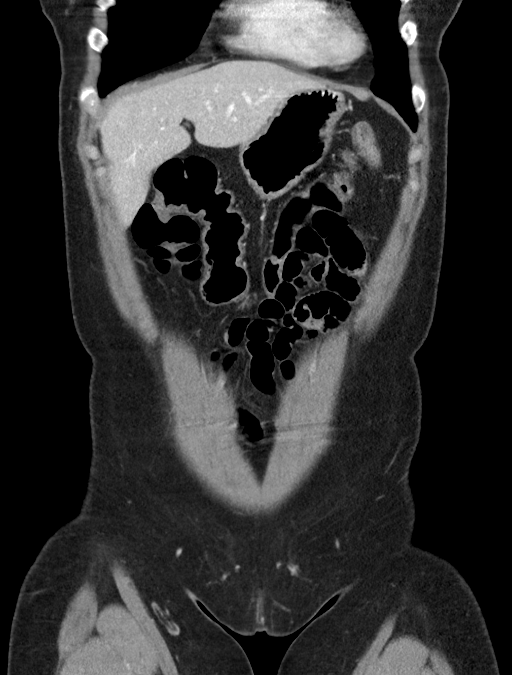
[im 40/89  soft-tissue]
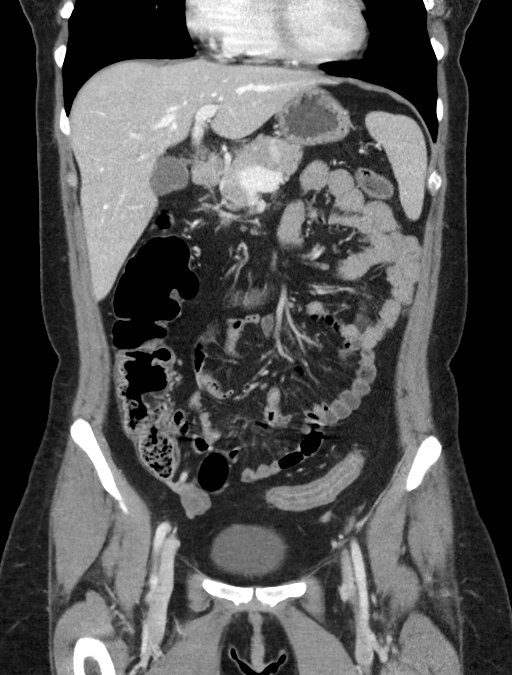
[im 49/89  soft-tissue]
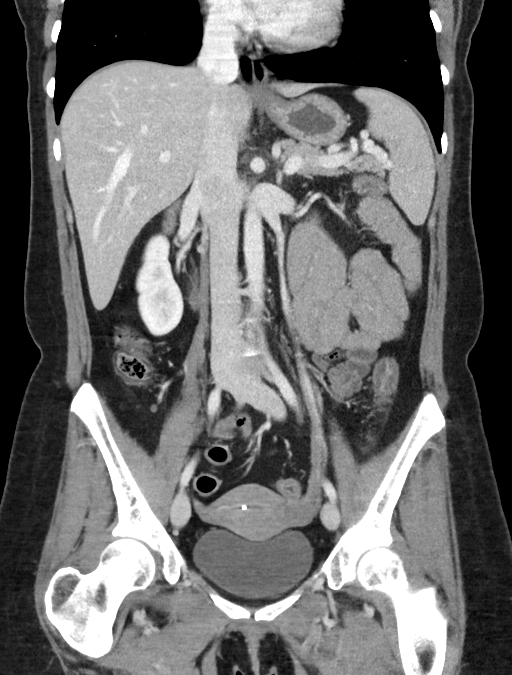

[16 of 46 positions shown; findings below may reference images not displayed]

FINDINGS: Lower chest: The visualized lung bases are clear bilaterally.
Visualized heart and pericardium are unremarkable.

Hepatobiliary: No focal liver abnormality is seen. No gallstones,
gallbladder wall thickening, or biliary dilatation.

Pancreas: Unremarkable

Spleen: Unremarkable

Adrenals/Urinary Tract: Adrenal glands are unremarkable. Kidneys are
normal, without renal calculi, focal lesion, or hydronephrosis.
Bladder is unremarkable.

Stomach/Bowel: There is long segment bowel wall thickening and
pericolonic inflammatory stranding involving the descending and
sigmoid colon as well as the rectum in keeping with changes of an
infectious or inflammatory colitis as can be seen with ulcerative
colitis or pseudomembranous colitis. No evidence of obstruction or
perforation. The stomach, small bowel, and large bowel are otherwise
unremarkable. Appendix normal. No free intraperitoneal gas or fluid.

Vascular/Lymphatic: No significant vascular findings are present. No
enlarged abdominal or pelvic lymph nodes.

Reproductive: Intrauterine device in expected position within the
endometrial cavity. The pelvic organs are otherwise unremarkable.

Other: Tiny fat containing umbilical hernia.

Musculoskeletal: No acute bone abnormality.
IMPRESSION: Long segment inflammatory change involving the a descending and
rectosigmoid colon as well as the rectum compatible with an
infectious or inflammatory proctocolitis. No evidence of obstruction
or perforation.

## 2022-11-21 ENCOUNTER — Other Ambulatory Visit: Payer: Self-pay | Admitting: Family Medicine

## 2022-11-24 NOTE — Telephone Encounter (Signed)
Nurses Please communicate with Tabatha I cannot tell from the gastroenterologist note why they discontinued this medicine Verify with patient does she want this medicine?  If so I would be fine with refilling it x 2

## 2022-11-25 NOTE — Telephone Encounter (Signed)
Left message to return call 

## 2023-01-25 ENCOUNTER — Telehealth: Payer: Self-pay | Admitting: Family Medicine

## 2023-01-25 DIAGNOSIS — R0789 Other chest pain: Secondary | ICD-10-CM | POA: Diagnosis not present

## 2023-01-25 DIAGNOSIS — Z6824 Body mass index (BMI) 24.0-24.9, adult: Secondary | ICD-10-CM | POA: Diagnosis not present

## 2023-01-25 DIAGNOSIS — T1490XA Injury, unspecified, initial encounter: Secondary | ICD-10-CM | POA: Diagnosis not present

## 2023-01-25 NOTE — Telephone Encounter (Signed)
Patient fell on 3/26 and bruised or crack a rib.She states not any better  and wanting appointment please advise where to put her.

## 2023-01-25 NOTE — Telephone Encounter (Signed)
Patient is wanting to know if she can get an order for x-ray of her ribs today or tomorrow before her appointment . Please advise.

## 2023-01-25 NOTE — Telephone Encounter (Signed)
Patient called back after lunch and cancel appointment for tomorrow went to urgent care instead.

## 2023-01-25 NOTE — Telephone Encounter (Signed)
4 PM on Tuesday could work her in

## 2023-01-26 ENCOUNTER — Ambulatory Visit: Payer: BC Managed Care – PPO | Admitting: Family Medicine

## 2023-02-08 ENCOUNTER — Telehealth: Payer: Self-pay

## 2023-02-08 NOTE — Telephone Encounter (Signed)
Patient wants to speak with someone about her birth control

## 2023-02-16 ENCOUNTER — Ambulatory Visit: Payer: BC Managed Care – PPO | Admitting: Family Medicine

## 2023-02-18 NOTE — Telephone Encounter (Signed)
Appt made

## 2023-02-23 ENCOUNTER — Ambulatory Visit: Payer: BC Managed Care – PPO | Admitting: Obstetrics and Gynecology

## 2023-02-23 ENCOUNTER — Encounter: Payer: Self-pay | Admitting: Obstetrics and Gynecology

## 2023-02-23 VITALS — BP 98/60 | HR 82 | Ht 63.0 in | Wt 136.0 lb

## 2023-02-23 DIAGNOSIS — T8332XA Displacement of intrauterine contraceptive device, initial encounter: Secondary | ICD-10-CM

## 2023-02-23 DIAGNOSIS — Z975 Presence of (intrauterine) contraceptive device: Secondary | ICD-10-CM

## 2023-02-23 DIAGNOSIS — Z30433 Encounter for removal and reinsertion of intrauterine contraceptive device: Secondary | ICD-10-CM

## 2023-02-23 MED ORDER — LEVONORGESTREL 20 MCG/DAY IU IUD: 1.0000 | INTRAUTERINE_SYSTEM | Freq: Once | INTRAUTERINE | Status: DC

## 2023-02-23 NOTE — Progress Notes (Signed)
    GYNECOLOGY CLINIC PROCEDURE NOTE  Monica Young is a 42 y.o. [id number] here for Mirena IUD removal and reinsertion. No GYN concerns.  Last pap smear was on 7/22 and was normal.  IUD Removal and Reinsertion  Patient identified, informed consent performed, consent signed.   Discussed risks of irregular bleeding, cramping, infection, malpositioning or misplacement of the IUD outside the uterus which may require further procedures. Also advised to use backup contraception for one week as the risk of pregnancy is higher during the transition period of removing an IUD and replacing it with another one. Time out was performed. Speculum placed in the vagina. The strings of the IUD were not visible. Attempts to locate strings and remove were unsuccessful. Procedure was then terminated.  Will schedule pt for Gyn U/S d/t to lost IUD strings  Nettie Elm, MD, FACOG Attending Obstetrician & Gynecologist Center for [facility], [facility]

## 2023-03-05 ENCOUNTER — Encounter: Payer: Self-pay | Admitting: Obstetrics & Gynecology

## 2023-03-05 ENCOUNTER — Ambulatory Visit: Payer: BC Managed Care – PPO | Admitting: Obstetrics & Gynecology

## 2023-03-05 ENCOUNTER — Ambulatory Visit (INDEPENDENT_AMBULATORY_CARE_PROVIDER_SITE_OTHER): Payer: BC Managed Care – PPO

## 2023-03-05 VITALS — BP 104/66 | HR 79 | Ht 63.0 in | Wt 138.0 lb

## 2023-03-05 DIAGNOSIS — Z30433 Encounter for removal and reinsertion of intrauterine contraceptive device: Secondary | ICD-10-CM | POA: Diagnosis not present

## 2023-03-05 DIAGNOSIS — T8332XA Displacement of intrauterine contraceptive device, initial encounter: Secondary | ICD-10-CM | POA: Diagnosis not present

## 2023-03-05 DIAGNOSIS — Z30430 Encounter for insertion of intrauterine contraceptive device: Secondary | ICD-10-CM

## 2023-03-05 DIAGNOSIS — Z30432 Encounter for removal of intrauterine contraceptive device: Secondary | ICD-10-CM

## 2023-03-05 MED ORDER — LEVONORGESTREL 20 MCG/DAY IU IUD
1.0000 | INTRAUTERINE_SYSTEM | Freq: Once | INTRAUTERINE | Status: AC
  Administered 2023-03-05: 1 via INTRAUTERINE

## 2023-03-05 NOTE — Progress Notes (Signed)
PELVIC US TA/TV: heterogeneous anteverted uterus with multiple fibroids,(#1) anterior left subserosal fibroid 4.4 x 3.7 x 4.2 cm,(#2) anterior subserosal fibroid 2.3 x 1.9 x 1.2 cm,EEC 5.8 mm,IUD is centrally located within the endometrium,normal ovaries,ovaries appear mobile,no free fluid,some left adnexal discomfort during ultrasound

## 2023-03-05 NOTE — Progress Notes (Signed)
Follow up appointment for results: IUD placement  Chief Complaint  Patient presents with   Contraception    Blood pressure 104/66, pulse 79, height 5\' 3"  (1.6 m), weight 138 lb (62.6 kg).  US PELVIC COMPLETE WITH TRANSVAGINAL  Result Date: 03/05/2023  .Marland Kitchenan Financial trader of Ultrasound Medicine Technical sales engineer) accredited practice [facility] for [facility] [address] [place] [address] Ordering Provider: Hermina Staggers, MD                                                              GYNECOLOGIC SONOGRAM NIMISHA RADICE is a 42 y.o. [id number] No LMP recorded. (Menstrual status: IUD). She is here for a pelvic sonogram to check IUD location. Uterus                      7.5 x 4.8 x 6.8 cm, Total uterine volume 128 cc,heterogeneous anteverted uterus with multiple fibroids,(#1) anterior left subserosal fibroid 4.4 x 3.7 x 4.2 cm,(#2) anterior subserosal fibroid 2.3 x 1.9 x 1.2 cm Endometrium          5.8 mm, symmetrical, IUD is centrally located within the endometrium Right ovary             2.2 x 1.7 x 2.9 cm, normal Left ovary                3.4 x 1.9 x 2.9 cm, normal No free fluid Technician Comments: PELVIC US TA/TV: heterogeneous anteverted uterus with multiple fibroids,(#1) anterior left subserosal fibroid 4.4 x 3.7 x 4.2 cm,(#2) anterior subserosal fibroid 2.3 x 1.9 x 1.2 cm,EEC 5.8 mm,IUD is centrally located within the endometrium,normal ovaries,ovaries appear mobile,no free fluid,some left adnexal discomfort during ultrasound [employer] 03/05/2023 9:53 AM Clinical Impression and recommendations: I have reviewed the sonogram results above, combined with the patient's current clinical course, below are my impressions and any appropriate recommendations for management based on the sonographic findings. Uterus 2 small fibroids, overall normal size shape and contour Endometrium 5.8 mm with IUD in place Ovaries: both ovaries are normal size shape and morphology Lazaro Arms  03/05/2023 10:19 AM     MIrena IUD in place Cervix dilated serially IUD removed with long curved Nicholaus Bloom without difficulty  IUD Insertion Procedure Note  Pre-operative Diagnosis: desires IUDS replacement  Post-operative Diagnosis: same  Indications: contraception  Procedure Details  Urine pregnancy test was not done.  The risks (including infection, bleeding, pain, and uterine perforation) and benefits of the procedure were explained to the patient and Written informed consent was obtained.    Cervix cleansed with Betadine. Uterus sounded to 8 cm. IUD inserted without difficulty. String visible and trimmed. Patient tolerated procedure well.  IUD Information: Mirena, Lot # [id number], Expiration date 02/2025.  Condition: Stable  Complications: None  Plan:  The patient was advised to call for any fever or for prolonged or severe pain or bleeding. She was advised to use OTC ibuprofen as needed for mild to moderate pain.   Attending Physician Documentation: I performed it  MEDS ordered this encounter: No orders of the defined types were placed in this encounter.   Orders for this encounter: No orders of the defined types were placed in this encounter.   Impression +  Management Plan   ICD-10-CM   1. Encounter for removal of intrauterine contraceptive device (IUD)  Z30.432     2. Encounter for insertion of mirena IUD  Z30.430       Follow Up: Return if symptoms worsen or fail to improve.     All questions were answered.  Past Medical History:  Diagnosis Date   GERD (gastroesophageal reflux disease)    IBS (irritable bowel syndrome)     Past Surgical History:  Procedure Laterality Date   BALLOON DILATION  01/03/2021   Procedure: BALLOON DILATION;  Surgeon: Lanelle Bal, DO;  [facility];  Service: Endoscopy;;   BIOPSY  01/03/2021   Procedure: BIOPSY;  Surgeon: Lanelle Bal, DO;  [facility];  Service: Endoscopy;;   COLONOSCOPY  WITH PROPOFOL N/A 01/03/2021   Procedure: COLONOSCOPY WITH PROPOFOL;  Surgeon: Lanelle Bal, DO;  [facility];  Service: Endoscopy;  Laterality: N/A;  am, pt was covid+ 1/3 <90 days   ESOPHAGOGASTRODUODENOSCOPY (EGD) WITH PROPOFOL N/A 01/03/2021   Procedure: ESOPHAGOGASTRODUODENOSCOPY (EGD) WITH PROPOFOL;  Surgeon: Lanelle Bal, DO;  [facility];  Service: Endoscopy;  Laterality: N/A;   None     WISDOM TOOTH EXTRACTION      OB History     Gravida  3   Para  2   Term  2   Preterm      AB  1   Living  2      SAB  1   IAB      Ectopic      Multiple      Live Births  2           No Known Allergies  Social History   Socioeconomic History   Marital status: Married    Spouse name: Not on file   Number of children: Not on file   Years of education: Not on file   Highest education level: Not on file  Occupational History   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  Other Topics Concern   Not on file  Social History Narrative   Not on file   Social Determinants of Health   Financial Resource Strain: Not on file  Food Insecurity: Not on file  Transportation Needs: Not on file  Physical Activity: Not on file  Stress: Not on file  Social Connections: Not on file    Family History  Problem Relation Age of Onset   Diverticulitis Mother    Hypertension Father    Heart disease Father    Pancreatitis Brother    Alcohol abuse Brother    Heart disease Maternal Grandmother    Stroke Maternal Grandfather    Cancer Paternal Grandmother        lung   COPD Paternal Grandfather    Heart disease Paternal  Grandfather    Crohn's disease Neg Hx    Ulcerative colitis Neg Hx

## 2023-03-05 NOTE — Addendum Note (Signed)
Addended by: Annamarie Dawley on: 03/05/2023 11:36 AM   Modules accepted: Orders

## 2023-04-09 ENCOUNTER — Other Ambulatory Visit: Payer: Self-pay | Admitting: Gastroenterology

## 2023-04-09 DIAGNOSIS — K219 Gastro-esophageal reflux disease without esophagitis: Secondary | ICD-10-CM

## 2023-06-30 ENCOUNTER — Ambulatory Visit: Payer: BLUE CROSS/BLUE SHIELD | Admitting: Obstetrics & Gynecology

## 2023-06-30 ENCOUNTER — Encounter: Payer: Self-pay | Admitting: Obstetrics & Gynecology

## 2023-06-30 VITALS — BP 95/55 | HR 71 | Ht 63.0 in | Wt 145.0 lb

## 2023-06-30 DIAGNOSIS — R102 Pelvic and perineal pain: Secondary | ICD-10-CM

## 2023-06-30 DIAGNOSIS — Z1231 Encounter for screening mammogram for malignant neoplasm of breast: Secondary | ICD-10-CM

## 2023-06-30 NOTE — Addendum Note (Signed)
Addended by: Sharon Seller on: 06/30/2023 01:08 PM   Modules accepted: Orders

## 2023-06-30 NOTE — Progress Notes (Addendum)
GYN VISIT Patient name: Monica Young MRN [id number]  Date of birth: 08/31/1981 Chief Complaint:   Pelvic Pain (Right ovary pain)  History of Present Illness:   Monica Young is a 42 y.o. [phone] female being seen today for the following concerns:  Pelvic pain: This has been an ongoing issue since before IUD replacement.  She notes on right side will have a pain that comes/goes- not monthly.  Feels cramping, last a few seconds and then may return.  Pain typically occurs every couple of days then resolve for a few days.  Not taking any medication, but could.  Denies dyspareunia.  Note h/o IBS  Of note, mirena recently replaced June 2024.  Initially, Dr. Alysia Penna could not see strings, [place] May 2024- confirmed proper location and showed the following:  heterogeneous anteverted uterus with multiple fibroids,(#1) anterior left subserosal fibroid 4.4 x 3.7 x 4.2 cm,(#2) anterior subserosal fibroid 2.3 x 1.9 x 1.2 cm,EEC 5.8 mm,IUD is centrally located within the endometrium  -Normal ovaries,ovaries appear mobile,no free fluid  With Mirena, pt does not have a period  No LMP recorded. (Menstrual status: IUD).    Review of Systems:   Pertinent items are noted in HPI Denies fever/chills, dizziness, headaches, visual disturbances, fatigue, shortness of breath, chest pain, abdominal pain, vomiting, no problems with periods, urination, or intercourse unless otherwise stated above.  Pertinent History Reviewed:   Past Surgical History:  Procedure Laterality Date   BALLOON DILATION  01/03/2021   Procedure: BALLOON DILATION;  Surgeon: Lanelle Bal, DO;  [facility];  Service: Endoscopy;;   BIOPSY  01/03/2021   Procedure: BIOPSY;  Surgeon: Lanelle Bal, DO;  [facility];  Service: Endoscopy;;   COLONOSCOPY WITH PROPOFOL N/A 01/03/2021   Procedure: COLONOSCOPY WITH PROPOFOL;  Surgeon: Lanelle Bal, DO;  [facility];  Service: Endoscopy;  Laterality:  N/A;  am, pt was covid+ 1/3 <90 days   ESOPHAGOGASTRODUODENOSCOPY (EGD) WITH PROPOFOL N/A 01/03/2021   Procedure: ESOPHAGOGASTRODUODENOSCOPY (EGD) WITH PROPOFOL;  Surgeon: Lanelle Bal, DO;  [facility];  Service: Endoscopy;  Laterality: N/A;   None     WISDOM TOOTH EXTRACTION      Past Medical History:  Diagnosis Date   GERD (gastroesophageal reflux disease)    IBS (irritable bowel syndrome)    Reviewed problem list, medications and allergies. Physical Assessment:   Vitals:   06/30/23 1137  BP: (!) 95/55  Pulse: 71  Weight: 145 lb (65.8 kg)  Height: 5\' 3"  (1.6 m)  Body mass index is 25.69 kg/m.       Physical Examination:   General appearance: alert, well appearing, and in no distress  Psych: mood appropriate, normal affect  Skin: warm & dry   Cardiovascular: normal heart rate noted  Respiratory: normal respiratory effort, no distress  Abdomen: soft, no masses, no rebound, no guarding, no reproducible pain  Pelvic: exam declined by the patient  Extremities: no edema   Chaperone: N/A    Assessment & Plan:  1) Pelvic pain- right side -reviewed prior US that showed normal ovaries bilaterally -discussed alternative etiology including GI or musculoskeletal -questions/concerns were addressed, will continue to monitor at this time -should she note worsening or persistence of symptoms may consider repeat US  2) preventive screening -pap up to date -mammogram ordered   Orders Placed This Encounter  Procedures   MM 3D SCREENING MAMMOGRAM BILATERAL BREAST    Return for July annual.  Myna Hidalgo, DO Attending Obstetrician & Gynecologist, [facility] for [employer], [facility]

## 2023-09-17 ENCOUNTER — Other Ambulatory Visit: Payer: Self-pay | Admitting: Nurse Practitioner

## 2023-09-17 ENCOUNTER — Telehealth: Payer: Self-pay | Admitting: Family Medicine

## 2023-09-17 MED ORDER — SERTRALINE HCL 50 MG PO TABS: 50.0000 mg | ORAL_TABLET | Freq: Every day | ORAL | 0 refills | Status: DC

## 2023-09-17 NOTE — Telephone Encounter (Signed)
Copied from CRM [id number]. Topic: Clinical - Prescription Issue >> Sep 16, 2023  4:00 PM Sasha H wrote: Reason for CRM: pt needs sertraline (ZOLOFT) 50 MG [facility] [address] Needs 90 ct

## 2023-09-22 ENCOUNTER — Encounter: Payer: Self-pay | Admitting: Family Medicine

## 2023-09-22 ENCOUNTER — Ambulatory Visit (INDEPENDENT_AMBULATORY_CARE_PROVIDER_SITE_OTHER): Payer: BLUE CROSS/BLUE SHIELD | Admitting: Family Medicine

## 2023-09-22 VITALS — BP 105/70 | HR 78 | Temp 97.9°F | Ht 63.0 in | Wt 148.0 lb

## 2023-09-22 DIAGNOSIS — G4700 Insomnia, unspecified: Secondary | ICD-10-CM

## 2023-09-22 DIAGNOSIS — Z13220 Encounter for screening for lipoid disorders: Secondary | ICD-10-CM

## 2023-09-22 DIAGNOSIS — R5383 Other fatigue: Secondary | ICD-10-CM | POA: Diagnosis not present

## 2023-09-22 DIAGNOSIS — E538 Deficiency of other specified B group vitamins: Secondary | ICD-10-CM

## 2023-09-22 DIAGNOSIS — N951 Menopausal and female climacteric states: Secondary | ICD-10-CM

## 2023-09-22 MED ORDER — SERTRALINE HCL 50 MG PO TABS: 50.0000 mg | ORAL_TABLET | Freq: Every day | ORAL | 3 refills | Status: DC

## 2023-09-22 MED ORDER — RIZATRIPTAN BENZOATE 10 MG PO TABS: 10.0000 mg | ORAL_TABLET | ORAL | 2 refills | Status: AC | PRN

## 2023-09-22 NOTE — Progress Notes (Unsigned)
   Subjective:    Patient ID: Monica Young, female    DOB: 12/17/1980, 42 y.o.   MRN: [id number]  HPI Other fatigue - Plan: Basic Metabolic Panel, CBC with Differential, Vitamin B12, Hepatic Function Panel, TSH + free T4  Screening for hyperlipidemia - Plan: Lipid Panel  B12 deficiency - Plan: Vitamin B12  Insomnia, unspecified type  Perimenopausal - Plan: FSH/LH  Patient overall doing fairly well she does relate she has a lot of stress tolerance fair amount of fatigue tiredness feeling rundown does not sleep well sometimes has hard time falling asleep other times hard time staying asleep other times just does not get many hours at 9 finds himself feeling tired during the day denies snoring denies stopping breathing Patient does try to eat relatively healthy She does states she is having perimenopausal symptoms She is requesting lab work She does have some intermittent migraines She does take her sertraline to help with stress and denies being depressed    Review of Systems     Objective:   Physical Exam  General-in no acute distress Eyes-no discharge Lungs-respiratory rate normal, CTA CV-no murmurs,RRR Extremities skin warm dry no edema Neuro grossly normal Behavior normal, alert       Assessment & Plan:  1. Other fatigue Significant fatigue tiredness cannot rule out the possibility of underlying sleep apnea Supportive measures discussed If significant wheezing snoring difficulty breathing at night notify us and we will help set up sleep study - Basic Metabolic Panel - CBC with Differential - Vitamin B12 - Hepatic Function Panel - TSH + free T4  2. Screening for hyperlipidemia Screening - Lipid Panel  3. B12 deficiency B12 ordered await results  4. Insomnia, unspecified type Behavioral techniques discussed on how to improve  5. Perimenopausal Screening labs - FSH/LH  Refills given on sertraline stress levels doing reasonably well  Refills given  on Maxalt does have intermittent migraines  Follow-up within 1 year sooner if any problems

## 2023-09-28 ENCOUNTER — Other Ambulatory Visit: Payer: Self-pay | Admitting: Family Medicine

## 2023-09-28 ENCOUNTER — Telehealth: Payer: Self-pay | Admitting: *Deleted

## 2023-09-28 MED ORDER — ONDANSETRON HCL 8 MG PO TABS: 8.0000 mg | ORAL_TABLET | Freq: Three times a day (TID) | ORAL | 5 refills | Status: AC | PRN

## 2023-09-28 NOTE — Telephone Encounter (Signed)
[facility]

## 2023-09-28 NOTE — Telephone Encounter (Unsigned)
Copied from CRM [id number]. Topic: Clinical - Medication Refill >> Sep 28, 2023 11:41 AM Prudencio Pair wrote: Most Recent Primary Care Visit:  Provider: Lilyan Punt A  Department: [facility]  Visit Type: OFFICE VISIT  Date: 09/22/2023  Medication: Ondansetron (Zofran) 8MG   Has the patient contacted their pharmacy? No, needs it sent to different pharmacy. (Agent: If no, request that the patient contact the pharmacy for the refill. If patient does not wish to contact the pharmacy document the reason why and proceed with request.) (Agent: If yes, when and what did the pharmacy advise?)  Is this the correct pharmacy for this prescription? Yes If no, delete pharmacy and type the correct one.  This is the patient's preferred pharmacy:   [id number] [place], [place] - [address] [place] [address] Phone: [phone] Fax: [phone]   Has the prescription been filled recently? No  Is the patient out of the medication? Yes  Has the patient been seen for an appointment in the last year OR does the patient have an upcoming appointment? Yes  Can we respond through MyChart? Yes  Agent: Please be advised that Rx refills may take up to 3 business days. We ask that you follow-up with your pharmacy.

## 2023-10-07 LAB — BASIC METABOLIC PANEL
BUN/Creatinine Ratio: 16 (ref 9–23)
BUN: 10 mg/dL (ref 6–24)
CO2: 22 mmol/L (ref 20–29)
Calcium: 9.1 mg/dL (ref 8.7–10.2)
Chloride: 104 mmol/L (ref 96–106)
Creatinine, Ser: 0.62 mg/dL (ref 0.57–1.00)
Glucose: 86 mg/dL (ref 70–99)
Potassium: 4.3 mmol/L (ref 3.5–5.2)
Sodium: 141 mmol/L (ref 134–144)
eGFR: 115 mL/min/{1.73_m2} (ref >59)

## 2023-10-07 LAB — HEPATIC FUNCTION PANEL
ALT: 10 [IU]/L (ref 0–32)
AST: 14 [IU]/L (ref 0–40)
Albumin: 4.6 g/dL (ref 3.9–4.9)
Alkaline Phosphatase: 52 [IU]/L (ref 44–121)
Bilirubin Total: 0.5 mg/dL (ref 0.0–1.2)
Bilirubin, Direct: 0.13 mg/dL (ref 0.00–0.40)
Total Protein: 6.8 g/dL (ref 6.0–8.5)

## 2023-10-07 LAB — CBC WITH DIFFERENTIAL/PLATELET
Basophils Absolute: 0 10*3/uL (ref 0.0–0.2)
Basos: 1 %
EOS (ABSOLUTE): 0 10*3/uL (ref 0.0–0.4)
Eos: 1 %
Hematocrit: 39.5 % (ref 34.0–46.6)
Hemoglobin: 12.9 g/dL (ref 11.1–15.9)
Immature Grans (Abs): 0 10*3/uL (ref 0.0–0.1)
Immature Granulocytes: 0 %
Lymphocytes Absolute: 1.6 10*3/uL (ref 0.7–3.1)
Lymphs: 37 %
MCH: 28.6 pg (ref 26.6–33.0)
MCHC: 32.7 g/dL (ref 31.5–35.7)
MCV: 88 fL (ref 79–97)
Monocytes Absolute: 0.3 10*3/uL (ref 0.1–0.9)
Monocytes: 8 %
Neutrophils Absolute: 2.4 10*3/uL (ref 1.4–7.0)
Neutrophils: 53 %
Platelets: 285 10*3/uL (ref 150–450)
RBC: 4.51 x10E6/uL (ref 3.77–5.28)
RDW: 12.3 % (ref 11.7–15.4)
WBC: 4.4 10*3/uL (ref 3.4–10.8)

## 2023-10-07 LAB — LIPID PANEL
Chol/HDL Ratio: 3.9 {ratio} (ref 0.0–4.4)
Cholesterol, Total: 206 mg/dL — ABNORMAL HIGH (ref 100–199)
HDL: 53 mg/dL (ref >39)
LDL Chol Calc (NIH): 140 mg/dL — ABNORMAL HIGH (ref 0–99)
Triglycerides: 71 mg/dL (ref 0–149)
VLDL Cholesterol Cal: 13 mg/dL (ref 5–40)

## 2023-10-07 LAB — VITAMIN B12: Vitamin B-12: 1229 pg/mL (ref 232–1245)

## 2023-10-07 LAB — FSH/LH
FSH: 5.2 m[IU]/mL
LH: 15.4 m[IU]/mL

## 2023-10-07 LAB — TSH+FREE T4
Free T4: 1.07 ng/dL (ref 0.82–1.77)
TSH: 1.04 u[IU]/mL (ref 0.450–4.500)

## 2023-11-08 NOTE — Progress Notes (Signed)
 GI Office Note    Referring Provider: Alphonsa Glendia LABOR, MD Primary Care Physician:  Alphonsa Glendia LABOR, MD Primary Gastroenterologist: Carlin POUR. Cindie, DO  Date:  11/09/2023  ID:  Monica Young, DOB 25-Apr-1981, MRN [id number]   Chief Complaint   Chief Complaint  Patient presents with   Dysphagia    Having spasms in her chest area when she swallows. Feels like food is getting stuck.    History of Present Illness  Monica Young is a 43 y.o. female with a history of GERD and IBS presenting today with complaint of chest pain, chest fullness, globus sensation, dysphagia.   OV January 2022.  Reported history of asthma, paralysis or stool systolically, not on any chronic medications.  1 month prior she began having nausea/vomiting for few weeks woke up with dry scaly patches under her eyes.  She then began having diarrhea and subsequent abdominal pain followed by bloody diarrhea.  She was placed on Cipro  and Flagyl  in the ED.  She noted being nauseous and having streaks of blood in her stool the night prior to her visit.  Reported similar pain and diarrhea 10 years prior but did not remember if it was bloody.  She had recent CT scan in the ED with longstanding inflammatory change involving descending and rectosigmoid colon as well as rectum compatible with infectious or inflammatory proctocolitis.  She reported clinically feeling better overall. Having chronic GERD takes over-the-counter PPI but did notice more burping.  Denies frequent NSAID use, was having coughing after eating, denied dysphagia.  Patient requested EGD at time of colonoscopy.  No stool studies were collected.   EGD 01/03/21: - Benign-appearing esophageal stenosis s/p dilation - gastritis - normal duodenum s/p biopsy - path: Stomach biopsy negative for H. pylori.  Esophageal biopsy with mild vascular congestion and focal squamous ballooning suggestive of reflux esophagitis.  Duodenal biopsies negative.   Colonoscopy  01/03/2021:  - nonbleeding internal hemorrhoids - examined portion of ileum normal s/p biopsy - other random colon biopsies negative. - repeat colonoscopy in 10 years  Last office visit 06/04/22 for 1 week N/V. Also intermittent nonbloody diarrhea. Neg COVID and pregnancy test. Reported emesis was partially digested food. Eating/drinking mostly liquids and had bene regurgitating most solids. Reported she thought her symptoms could have been from a bad salad. Advised PPI daily. Bentyl  TID as needed. Continue zofran  as needed. Diet recommendations given.   Today:   She states she has had a chest spasm over the weekend and the following day her chest was sore and lasted about 24 hours and when she has been trying to drink she could feel it go down and was painful. At the diaphragm area she felt she could feel the food passing through there. Feeling sore in her RUQ but not mid chest. She reports the pain was so severe it was hard to sleep. She tried Zofran , tums, and tylenol for relief. Does not have typical reflux symptoms   She stopped pantoprazole  about a year ago due to fear of long term side effects.   The next day she did have some high BP at home with her wrist cuff. She did admit to feeling anxious at the time as well. She reports systolic was 180s. She is usually 100-110s systolics.   No recent abdominal trauma or heavy lifting. No short of breath in general Did experience some during the chest pain.   Wt Readings from Last 3 Encounters:  11/09/23 146 lb 12.8 oz (  66.6 kg)  09/22/23 148 lb (67.1 kg)  06/30/23 145 lb (65.8 kg)    Current Outpatient Medications  Medication Sig Dispense Refill   levonorgestrel  (MIRENA ) 20 MCG/24HR IUD 1 each by Intrauterine route once.     ondansetron  (ZOFRAN ) 8 MG tablet Take 1 tablet (8 mg total) by mouth every 8 (eight) hours as needed for nausea or vomiting. 15 tablet 5   rizatriptan  (MAXALT ) 10 MG tablet Take 1 tablet (10 mg total) by mouth as needed  for migraine. May repeat in 2 hours if needed 10 tablet 2   sertraline  (ZOLOFT ) 50 MG tablet Take 1 tablet (50 mg total) by mouth daily. Needs office visit. 90 tablet 3   acetaminophen (TYLENOL) 500 MG tablet Take 1,000 mg by mouth every 8 (eight) hours as needed for moderate pain.     No current facility-administered medications for this visit.    Past Medical History:  Diagnosis Date   GERD (gastroesophageal reflux disease)    IBS (irritable bowel syndrome)     Past Surgical History:  Procedure Laterality Date   BALLOON DILATION  01/03/2021   Procedure: BALLOON DILATION;  Surgeon: Cindie Carlin POUR, DO;  [facility];  Service: Endoscopy;;   BIOPSY  01/03/2021   Procedure: BIOPSY;  Surgeon: Cindie Carlin POUR, DO;  [facility];  Service: Endoscopy;;   COLONOSCOPY WITH PROPOFOL  N/A 01/03/2021   Procedure: COLONOSCOPY WITH PROPOFOL ;  Surgeon: Cindie Carlin POUR, DO;  [facility];  Service: Endoscopy;  Laterality: N/A;  am, pt was covid+ 1/3 <90 days   ESOPHAGOGASTRODUODENOSCOPY (EGD) WITH PROPOFOL  N/A 01/03/2021   Procedure: ESOPHAGOGASTRODUODENOSCOPY (EGD) WITH PROPOFOL ;  Surgeon: Cindie Carlin POUR, DO;  [facility];  Service: Endoscopy;  Laterality: N/A;   None     WISDOM TOOTH EXTRACTION      Family History  Problem Relation Age of Onset   Diverticulitis Mother    Hypertension Father    Heart disease Father    Pancreatitis Brother    Alcohol abuse Brother    Heart disease Maternal Grandmother    Stroke Maternal Grandfather    Cancer Paternal Grandmother        lung   COPD Paternal Grandfather    Heart disease Paternal Grandfather    Crohn's disease Neg Hx    Ulcerative colitis Neg Hx     Allergies as of 11/09/2023   (No Known Allergies)    Social History   Socioeconomic History   Marital status: Married    Spouse name: Not on file   Number of children: Not on file   Years of education: Not on file   Highest education  level: Not on file  Occupational History   Occupation: [employer]    Comment: hospice nurse  Tobacco Use   Smoking status: Never   Smokeless tobacco: Never  Vaping Use   Vaping status: Never Used  Substance and Sexual Activity   Alcohol use: Yes    Alcohol/week: 0.0 standard drinks of alcohol    Comment: occassional   Drug use: No   Sexual activity: Yes    Birth control/protection: I.U.D.  Other Topics Concern   Not on file  Social History Narrative   Not on file   Social Drivers of Health   Financial Resource Strain: Not on file  Food Insecurity: Not on file  Transportation Needs: Not on file  Physical Activity: Not on file  Stress: Not on file  Social  Connections: Not on file     Review of Systems   Gen: Denies fever, chills, anorexia. Denies fatigue, weakness, weight loss.  CV: Denies chest pain, palpitations, syncope, peripheral edema, and claudication. Resp: Denies dyspnea at rest, cough, wheezing, coughing up blood, and pleurisy. GI: See HPI Derm: Denies rash, itching, dry skin Psych: Denies depression, anxiety, memory loss, confusion. No homicidal or suicidal ideation.  Heme: Denies bruising, bleeding, and enlarged lymph nodes.  Physical Exam   BP 105/66 (BP Location: Right Arm, Patient Position: Sitting, Cuff Size: Normal)   Pulse 87   Temp 98.5 F (36.9 C) (Temporal)   Ht 5' 3 (1.6 m)   Wt 146 lb 12.8 oz (66.6 kg)   BMI 26.00 kg/m   General:   Alert and oriented. No distress noted. Pleasant and cooperative.  Head:  Normocephalic and atraumatic. Eyes:  Conjuctiva clear without scleral icterus. Mouth:  Oral mucosa pink and moist. Good dentition. No lesions. Lungs:  Clear to auscultation bilaterally. No wheezes, rales, or rhonchi. No distress.  Heart:  S1, S2 present without murmurs appreciated.  Abdomen:  +BS, soft, non-tender and non-distended. No rebound or guarding. No HSM or masses noted. Rectal: deferred Msk:  Symmetrical  without gross deformities. Normal posture. Extremities:  Without edema. Neurologic:  Alert and  oriented x4 Psych:  Alert and cooperative. Normal mood and affect.  Assessment  Monica Young is a 43 y.o. female with a history of GERD and IBS presenting today with complaint of chest pain, chest fullness, globus sensation, dysphagia.   GERD: History of gastritis and esophagitis in 2022 on EGD.  Stenosis likely secondary to gastritis and esophagitis.  She was treated with pantoprazole  however has been off this for about a year given concern for taking long-term PPI (side effects).  Currently denies any significant frequent reflux symptoms.  Dysphagia, chest pain, esophageal spasm: Over the weekend had an episode of severe chest pain feeling like she was having spasming of her esophagus near her diaphragm.  Pain lasted about 24 hours and was unrelieved with Tylenol, antacid, and Zofran .  Has had history of esophageal stenosis on EGD in 2022 that was dilated.  She has been experiencing some intermittent coughing with meals but overall has been feeling sore in the epigastric region and lower chest since this episode and was also feeling very anxious secondary to the symptoms.  She denies any overt triggers and has sensation of feeling liquids and food passed over this area of the distal chest.  To rule out hiatal hernia we will obtain an upper GI series.  Also discussed potential for barium pill esophagram versus repeat upper endoscopy to evaluate her symptoms.  If she has recurrent esophageal spasm she would likely benefit from manometry.  Discussed resumption of PPI versus Voquezna however for now she would like to evaluate and monitor.  PLAN   UGI EKG Potential for BPE vs EGD vs manometry if UGI negative and/or ongoing symptoms.  Eat small bites of food and alternate with sips of water. Eat smaller more frequent meals Could consider Voquezna in the future if reflux symptoms worsen. Follow up TBD.      Charmaine Melia, MSN, FNP-BC, AGACNP-BC [facility]

## 2023-11-09 ENCOUNTER — Ambulatory Visit: Payer: BLUE CROSS/BLUE SHIELD | Admitting: Gastroenterology

## 2023-11-09 ENCOUNTER — Encounter: Payer: Self-pay | Admitting: Gastroenterology

## 2023-11-09 VITALS — BP 105/66 | HR 87 | Temp 98.5°F | Ht 63.0 in | Wt 146.8 lb

## 2023-11-09 DIAGNOSIS — R0789 Other chest pain: Secondary | ICD-10-CM

## 2023-11-09 DIAGNOSIS — R1319 Other dysphagia: Secondary | ICD-10-CM

## 2023-11-09 DIAGNOSIS — Z8719 Personal history of other diseases of the digestive system: Secondary | ICD-10-CM | POA: Diagnosis not present

## 2023-11-09 DIAGNOSIS — K219 Gastro-esophageal reflux disease without esophagitis: Secondary | ICD-10-CM | POA: Diagnosis not present

## 2023-11-09 DIAGNOSIS — R1310 Dysphagia, unspecified: Secondary | ICD-10-CM | POA: Diagnosis not present

## 2023-11-09 DIAGNOSIS — R079 Chest pain, unspecified: Secondary | ICD-10-CM

## 2023-11-09 NOTE — Patient Instructions (Addendum)
 We will get you scheduled for an UGI series as well as an EKG in the near future.   Depending on the results of the studies we can discuss further manometry versus repeating upper endoscopy.  We continue to have the option to also try Voquezna for treatment of reflux as well if the symptoms become more frequent.  Follow-up will be determined after receiving the results of your EKG and your upper GI study.  It was a pleasure to see you today. I want to create trusting relationships with patients. If you receive a survey regarding your visit,  I greatly appreciate you taking time to fill this out on paper or through your MyChart. I value your feedback.  Charmaine Melia, MSN, FNP-BC, AGACNP-BC [facility]

## 2023-11-10 ENCOUNTER — Ambulatory Visit (HOSPITAL_COMMUNITY)
Admission: RE | Admit: 2023-11-10 | Discharge: 2023-11-10 | Disposition: A | Discharge status: Home / Self Care | Payer: BLUE CROSS/BLUE SHIELD | Source: Ambulatory Visit | Attending: Gastroenterology | Admitting: Gastroenterology

## 2023-11-10 DIAGNOSIS — R9431 Abnormal electrocardiogram [ECG] [EKG]: Secondary | ICD-10-CM | POA: Insufficient documentation

## 2023-11-10 DIAGNOSIS — R0789 Other chest pain: Secondary | ICD-10-CM | POA: Diagnosis present

## 2023-11-11 ENCOUNTER — Telehealth: Payer: Self-pay

## 2023-11-11 ENCOUNTER — Encounter: Payer: Self-pay | Admitting: Family Medicine

## 2023-11-11 NOTE — Telephone Encounter (Signed)
Nurses As requested I looked at the EKG I reviewed over the EKG it does have some nonspecific findings that could indicate the possibility of a previous issue Also though-I have no contacts for what is going on with the patient currently.  She has an appointment coming up with Eber Jones in several days I would highly recommend keeping that appointment to discuss what is happening so we can determine if further test such as echo is needed or the possibility of referral to cardiology thanks (Obviously if patient having any extremis issues such as severe chest pain shortness of breath sweating etc. she may need to go to the ED although the likelihood of heart disease for this patient is low)

## 2023-11-11 NOTE — Telephone Encounter (Signed)
Copied from CRM [id number]. Topic: Clinical - Lab/Test Results >> Nov 11, 2023 10:38 AM Monica Young wrote: Reason for CRM: [id number]: Patient called to get assistance on her EKG she states it looks abnormal to her and just needed to know what the results actually were.

## 2023-11-11 NOTE — Telephone Encounter (Signed)
Contacted pt and advised per Drs notes regarding EKG results and recommendations, she agrees to all recommendations, she does not currently have any severe chest pain or sob at this time. She will keep appt and head to ER if the need arises.

## 2023-11-11 NOTE — Telephone Encounter (Signed)
Nurses-please see the phone message-I received the same message via phone and sent reply to the nurses regarding this-please discussed with patient then move forward accordingly thank you  The likelihood of this being a previous infarct is low but in the situations depending on the context of the patient's symptoms echo or other testing may be needed

## 2023-11-12 ENCOUNTER — Telehealth: Payer: Self-pay | Admitting: *Deleted

## 2023-11-12 ENCOUNTER — Ambulatory Visit (HOSPITAL_COMMUNITY)
Admission: RE | Admit: 2023-11-12 | Discharge: 2023-11-12 | Disposition: A | Discharge status: Home / Self Care | Payer: BLUE CROSS/BLUE SHIELD | Source: Ambulatory Visit | Attending: Gastroenterology

## 2023-11-12 DIAGNOSIS — R0789 Other chest pain: Secondary | ICD-10-CM | POA: Insufficient documentation

## 2023-11-12 DIAGNOSIS — R1319 Other dysphagia: Secondary | ICD-10-CM | POA: Insufficient documentation

## 2023-11-12 NOTE — Telephone Encounter (Signed)
Pt called and would like to know the results of her EKG. You can send her a MyChart message.

## 2023-11-15 ENCOUNTER — Encounter: Payer: Self-pay | Admitting: *Deleted

## 2023-11-15 NOTE — Telephone Encounter (Signed)
Noted  

## 2023-11-16 ENCOUNTER — Ambulatory Visit (INDEPENDENT_AMBULATORY_CARE_PROVIDER_SITE_OTHER): Payer: BLUE CROSS/BLUE SHIELD | Admitting: Nurse Practitioner

## 2023-11-16 VITALS — BP 99/67 | HR 87 | Temp 97.9°F | Ht 63.0 in | Wt 146.0 lb

## 2023-11-16 DIAGNOSIS — F419 Anxiety disorder, unspecified: Secondary | ICD-10-CM | POA: Diagnosis not present

## 2023-11-16 DIAGNOSIS — F32A Depression, unspecified: Secondary | ICD-10-CM | POA: Diagnosis not present

## 2023-11-16 DIAGNOSIS — K219 Gastro-esophageal reflux disease without esophagitis: Secondary | ICD-10-CM

## 2023-11-16 NOTE — Patient Instructions (Signed)
Take one and a half tabs of Zoloft per day.

## 2023-11-16 NOTE — Progress Notes (Signed)
   Subjective:    Patient ID: Monica Young, female    DOB: May 28, 1981, 43 y.o.   MRN: [id number]  HPI  Follow up for abnormal EKG done at GI office  Review of Systems     Objective:   Physical Exam        Assessment & Plan:

## 2023-11-16 NOTE — Progress Notes (Signed)
Subjective:    Patient ID: Monica Young, female    DOB: May 22, 1981, 43 y.o.   MRN: [id number]  HPI Aliegha presents today for a follow-up after an incidence of chest pain on 11/06/23. Has not had any symptoms since. She was seen by GI on 1/14, where they did an ECG and swallow study. Has a history of IBS. Patient was concerned about results of ECG. She described the chest pain as a "contraction" or feeling of a muscle spasm and is located in the middle of her lower chest. The pain occurred for 12 hours. She took tums, Zofran, Tylenol PM, and cough syrup for the pain with no relief. She did not eat anything or have any caffeine before the incident. Denies use of NSAIDs, BC powders or smoking. Does have a history of anxiety, and has been taking Zoloft for years. Says that she could go up on the medication for her anxiety.  Denies increased stress before the incident. Reports a family history of CVD.   Review of Systems  Constitutional:  Negative for chills, fatigue and fever.  HENT:  Negative for sore throat.        Positive for occasional trouble swallowing.   Respiratory:  Negative for cough, chest tightness, shortness of breath and wheezing.        Positive for mild cough with big meals.   Cardiovascular:  Negative for chest pain and palpitations.  Gastrointestinal:  Negative for abdominal pain, constipation, diarrhea, nausea and vomiting.  Psychiatric/Behavioral:  Positive for sleep disturbance. Negative for self-injury and suicidal ideas. The patient is nervous/anxious.       Objective:   Physical Exam Vitals and nursing note reviewed.  Constitutional:      General: She is not in acute distress.    Appearance: She is normal weight. She is not ill-appearing.  Cardiovascular:     Rate and Rhythm: Normal rate and regular rhythm.     Heart sounds: Normal heart sounds, S1 normal and S2 normal. No murmur heard. Pulmonary:     Effort: No respiratory distress.     Breath sounds: Normal  breath sounds. No wheezing.  Chest:     Comments: Tenderness with palpation noted in the midsternal region of the chest.  Abdominal:     General: There is no distension.     Palpations: Abdomen is soft.     Comments: Positive for mild epigastric tenderness with deep palpation. No obvious masses or abnormalities. Abdominal exam otherwise benign.   Neurological:     Mental Status: She is alert.  Psychiatric:        Mood and Affect: Mood normal.        Behavior: Behavior normal.        Thought Content: Thought content normal.        Judgment: Judgment normal.    Vitals:   11/16/23 1310  BP: 99/67  Pulse: 87  Temp: 97.9 F (36.6 C)  Height: 5\' 3"  (1.6 m)  Weight: 146 lb (66.2 kg)  SpO2: 100%  BMI (Calculated): 25.87        11/16/2023    1:12 PM 09/22/2023    3:11 PM 09/24/2022    1:32 PM 05/05/2021    4:46 PM 11/15/2020   12:20 PM  Depression screen PHQ 2/9  Decreased Interest 1 2 1 1 2   Down, Depressed, Hopeless 0 0 1 0 1  PHQ - 2 Score 1 2 2 1 3   Altered sleeping 1 2 0 1 3  Tired, decreased energy 1 3 1 1 3   Change in appetite 1 1 0 0 0  Feeling bad or failure about yourself  0 0 1 1 3   Trouble concentrating 0 0 0 0 1  Moving slowly or fidgety/restless 0 0 0 0 1  Suicidal thoughts 0 0 0 0 0  PHQ-9 Score 4 8 4 4 14   Difficult doing work/chores Somewhat difficult Not difficult at all Not difficult at all  Somewhat difficult       11/16/2023    1:12 PM 09/22/2023    3:14 PM 09/24/2022    1:33 PM 05/05/2021    4:45 PM  GAD 7 : Generalized Anxiety Score  Nervous, Anxious, on Edge 2 1 1 2   Control/stop worrying 1 1 1 2   Worry too much - different things 1 1 1 3   Trouble relaxing 0 1 0 2  Restless 1 1 0 1  Easily annoyed or irritable 1 1 1 1   Afraid - awful might happen 0 0 0 2  Total GAD 7 Score 6 6 4 13   Anxiety Difficulty Somewhat difficult Not difficult at all Not difficult at all Somewhat difficult       Assessment & Plan:  1. Gastroesophageal reflux  disease without esophagitis (Primary) -Based on ECG, anxiety history and description of the chest pain, do not feel this is cardiac but most likely GI. Continue follow up with GI as planned.   2. Anxiety and depression -Advised patient to increase Zoloft dose to 1 and a half pills (75 mg) with her medication that she has at home. Go back to 50 mg dose and contact office if any problems.   Warning signs reviewed. Contact us or go to ED if symptoms recur.   Return in about 4 weeks (around 12/14/2023) for video visit.   I have seen and examined this patient alongside the NP student. I have reviewed and verified the student note and agree with the assessment and plan.  Sherie Don, FNP

## 2023-11-19 ENCOUNTER — Encounter: Payer: Self-pay | Admitting: Nurse Practitioner

## 2023-11-24 ENCOUNTER — Encounter: Payer: Self-pay | Admitting: Family Medicine

## 2023-11-24 ENCOUNTER — Other Ambulatory Visit: Payer: Self-pay

## 2023-11-24 ENCOUNTER — Ambulatory Visit (INDEPENDENT_AMBULATORY_CARE_PROVIDER_SITE_OTHER): Payer: BLUE CROSS/BLUE SHIELD | Admitting: Family Medicine

## 2023-11-24 VITALS — BP 110/72 | HR 81 | Temp 98.9°F | Ht 63.0 in | Wt 144.6 lb

## 2023-11-24 DIAGNOSIS — B349 Viral infection, unspecified: Secondary | ICD-10-CM

## 2023-11-24 NOTE — Progress Notes (Signed)
   Subjective:    Patient ID: Monica Young, female    DOB: [birth year], 43 y.o.   MRN: [id number]  Discussed the use of AI scribe software for clinical note transcription with the patient, who gave verbal consent to proceed.  History of Present Illness   The patient presents with a persistent cough and associated symptoms for evaluation. Her daughter has also started experiencing similar symptoms.  She has been experiencing a constant, nagging cough for approximately eight days, which is particularly troublesome at night and affects her sleep. She uses a humidifier and takes Nyquil and Tylenol PM to aid in rest. No wheezing or shortness of breath, but she does wake up from coughing. Her ears have clogged up, possibly due to the coughing.  Her throat feels dry and painful upon swallowing, especially in the morning. She has been spitting up thick mucus, which she finds difficult to expel.  She experiences headaches and occasional chills, along with a low-grade fever of around 28F, primarily in the mornings. No muscle aches or night sweats.  She reports nasal drip and an itchy, runny nose with sneezing over the past few days. Recently, her eyes have felt 'cruddy' upon waking, and she feels tired with redness under her eyes.         Review of Systems     Objective:    Physical Exam   HEENT: Eardrums normal. Throat normal. CHEST: Lungs clear to auscultation. CARDIOVASCULAR: Heart auscultation normal.           Assessment & Plan:  Assessment and Plan    Upper Respiratory Infection Persistent cough, postnasal drip, and mild fever for approximately 8 days. No signs of pneumonia on examination. Likely viral etiology, possibly a mild case of influenza. -Continue supportive care with over-the-counter medications (Nyquil, Tylenol PM). -Contact office via MyChart if symptoms worsen or do not improve by early next week.  Conjunctivitis Reports of "cruddy" eyes upon waking and  redness. -Continue supportive care and monitor symptoms.  Insomnia Difficulty sleeping due to cough. -Continue current regimen of Nyquil and Tylenol PM. -Prop up with pillows to aid in sleep.  Potential for Secondary Bacterial Infection Given the duration of symptoms, there is a possibility of a secondary bacterial infection. -Monitor symptoms closely. -Consider antibiotics if symptoms worsen or do not improve by early next week.

## 2023-11-24 NOTE — Telephone Encounter (Signed)
Nurses I had sent a message upfront regarding the work note but must not have been seen  Please work with patient to give her the work note she needs thank you

## 2023-11-24 NOTE — Telephone Encounter (Signed)
Spoke to patient. Note written and will pick up on Friday.

## 2023-11-25 ENCOUNTER — Ambulatory Visit: Payer: BLUE CROSS/BLUE SHIELD | Admitting: Nurse Practitioner

## 2023-12-25 ENCOUNTER — Encounter: Payer: Self-pay | Admitting: Nurse Practitioner

## 2023-12-27 ENCOUNTER — Other Ambulatory Visit: Payer: Self-pay | Admitting: Nurse Practitioner

## 2023-12-27 MED ORDER — SERTRALINE HCL 50 MG PO TABS: ORAL_TABLET | ORAL | 1 refills | Status: DC

## 2024-07-06 ENCOUNTER — Other Ambulatory Visit: Payer: Self-pay | Admitting: Nurse Practitioner
